# Patient Record
Sex: Male | Born: 1950 | Race: White | Hispanic: No | Marital: Married | Smoking: Current every day smoker
Health system: Southern US, Community
[De-identification: ages and names within clinical notes are randomized; demographics above are authoritative.]

## PROBLEM LIST (undated history)

## (undated) DIAGNOSIS — C801 Malignant (primary) neoplasm, unspecified: Secondary | ICD-10-CM

## (undated) DIAGNOSIS — J449 Chronic obstructive pulmonary disease, unspecified: Secondary | ICD-10-CM

## (undated) DIAGNOSIS — D509 Iron deficiency anemia, unspecified: Secondary | ICD-10-CM

## (undated) DIAGNOSIS — I4891 Unspecified atrial fibrillation: Secondary | ICD-10-CM

## (undated) HISTORY — PX: COLON SURGERY: SHX602

---

## 2003-12-14 ENCOUNTER — Other Ambulatory Visit: Payer: Self-pay

## 2004-04-18 ENCOUNTER — Emergency Department: Payer: Self-pay | Admitting: Emergency Medicine

## 2004-04-19 ENCOUNTER — Emergency Department: Payer: Self-pay | Admitting: Emergency Medicine

## 2004-04-20 ENCOUNTER — Inpatient Hospital Stay: Payer: Self-pay | Admitting: Internal Medicine

## 2008-08-26 ENCOUNTER — Emergency Department: Payer: Self-pay | Admitting: Emergency Medicine

## 2009-05-07 ENCOUNTER — Inpatient Hospital Stay: Payer: Self-pay | Admitting: Internal Medicine

## 2019-01-02 ENCOUNTER — Emergency Department: Payer: Medicare Other

## 2019-01-02 ENCOUNTER — Inpatient Hospital Stay
Admission: EM | Admit: 2019-01-02 | Discharge: 2019-01-10 | DRG: 481 | Disposition: A | Discharge status: Skilled Nursing Facility | Payer: Medicare Other | Attending: Internal Medicine | Admitting: Internal Medicine

## 2019-01-02 ENCOUNTER — Other Ambulatory Visit: Payer: Self-pay

## 2019-01-02 DIAGNOSIS — L89302 Pressure ulcer of unspecified buttock, stage 2: Secondary | ICD-10-CM | POA: Diagnosis not present

## 2019-01-02 DIAGNOSIS — L8990 Pressure ulcer of unspecified site, unspecified stage: Secondary | ICD-10-CM | POA: Insufficient documentation

## 2019-01-02 DIAGNOSIS — Z1159 Encounter for screening for other viral diseases: Secondary | ICD-10-CM

## 2019-01-02 DIAGNOSIS — Z7982 Long term (current) use of aspirin: Secondary | ICD-10-CM

## 2019-01-02 DIAGNOSIS — D72829 Elevated white blood cell count, unspecified: Secondary | ICD-10-CM | POA: Diagnosis present

## 2019-01-02 DIAGNOSIS — I4891 Unspecified atrial fibrillation: Secondary | ICD-10-CM | POA: Diagnosis not present

## 2019-01-02 DIAGNOSIS — W19XXXA Unspecified fall, initial encounter: Secondary | ICD-10-CM

## 2019-01-02 DIAGNOSIS — F17210 Nicotine dependence, cigarettes, uncomplicated: Secondary | ICD-10-CM | POA: Diagnosis present

## 2019-01-02 DIAGNOSIS — J9610 Chronic respiratory failure, unspecified whether with hypoxia or hypercapnia: Secondary | ICD-10-CM | POA: Diagnosis present

## 2019-01-02 DIAGNOSIS — Z419 Encounter for procedure for purposes other than remedying health state, unspecified: Secondary | ICD-10-CM

## 2019-01-02 DIAGNOSIS — D62 Acute posthemorrhagic anemia: Secondary | ICD-10-CM | POA: Diagnosis not present

## 2019-01-02 DIAGNOSIS — E871 Hypo-osmolality and hyponatremia: Secondary | ICD-10-CM | POA: Diagnosis present

## 2019-01-02 DIAGNOSIS — K567 Ileus, unspecified: Secondary | ICD-10-CM

## 2019-01-02 DIAGNOSIS — S72141A Displaced intertrochanteric fracture of right femur, initial encounter for closed fracture: Principal | ICD-10-CM | POA: Diagnosis present

## 2019-01-02 DIAGNOSIS — Z716 Tobacco abuse counseling: Secondary | ICD-10-CM

## 2019-01-02 DIAGNOSIS — I959 Hypotension, unspecified: Secondary | ICD-10-CM | POA: Diagnosis not present

## 2019-01-02 DIAGNOSIS — D519 Vitamin B12 deficiency anemia, unspecified: Secondary | ICD-10-CM | POA: Diagnosis present

## 2019-01-02 DIAGNOSIS — R059 Cough, unspecified: Secondary | ICD-10-CM

## 2019-01-02 DIAGNOSIS — E876 Hypokalemia: Secondary | ICD-10-CM | POA: Diagnosis not present

## 2019-01-02 DIAGNOSIS — Z882 Allergy status to sulfonamides status: Secondary | ICD-10-CM

## 2019-01-02 DIAGNOSIS — M25551 Pain in right hip: Secondary | ICD-10-CM

## 2019-01-02 DIAGNOSIS — R05 Cough: Secondary | ICD-10-CM

## 2019-01-02 DIAGNOSIS — J449 Chronic obstructive pulmonary disease, unspecified: Secondary | ICD-10-CM | POA: Diagnosis present

## 2019-01-02 DIAGNOSIS — Z859 Personal history of malignant neoplasm, unspecified: Secondary | ICD-10-CM

## 2019-01-02 DIAGNOSIS — S72001A Fracture of unspecified part of neck of right femur, initial encounter for closed fracture: Secondary | ICD-10-CM

## 2019-01-02 DIAGNOSIS — Z9981 Dependence on supplemental oxygen: Secondary | ICD-10-CM

## 2019-01-02 DIAGNOSIS — I97191 Other postprocedural cardiac functional disturbances following other surgery: Secondary | ICD-10-CM | POA: Diagnosis not present

## 2019-01-02 DIAGNOSIS — W1830XA Fall on same level, unspecified, initial encounter: Secondary | ICD-10-CM | POA: Diagnosis present

## 2019-01-02 DIAGNOSIS — D509 Iron deficiency anemia, unspecified: Secondary | ICD-10-CM | POA: Diagnosis present

## 2019-01-02 HISTORY — DX: Malignant (primary) neoplasm, unspecified: C80.1

## 2019-01-02 HISTORY — DX: Chronic obstructive pulmonary disease, unspecified: J44.9

## 2019-01-02 LAB — CBC WITH DIFFERENTIAL/PLATELET
Abs Immature Granulocytes: 0.09 10*3/uL — ABNORMAL HIGH (ref 0.00–0.07)
Basophils Absolute: 0.1 10*3/uL (ref 0.0–0.1)
Basophils Relative: 1 %
Eosinophils Absolute: 0.3 10*3/uL (ref 0.0–0.5)
Eosinophils Relative: 4 %
HCT: 34.2 % — ABNORMAL LOW (ref 39.0–52.0)
Hemoglobin: 11.7 g/dL — ABNORMAL LOW (ref 13.0–17.0)
Immature Granulocytes: 1 %
Lymphocytes Relative: 20 %
Lymphs Abs: 1.8 10*3/uL (ref 0.7–4.0)
MCH: 34.1 pg — ABNORMAL HIGH (ref 26.0–34.0)
MCHC: 34.2 g/dL (ref 30.0–36.0)
MCV: 99.7 fL (ref 80.0–100.0)
Monocytes Absolute: 0.9 10*3/uL (ref 0.1–1.0)
Monocytes Relative: 10 %
Neutro Abs: 5.6 10*3/uL (ref 1.7–7.7)
Neutrophils Relative %: 64 %
Platelets: 202 10*3/uL (ref 150–400)
RBC: 3.43 MIL/uL — ABNORMAL LOW (ref 4.22–5.81)
RDW: 12.1 % (ref 11.5–15.5)
WBC: 8.7 10*3/uL (ref 4.0–10.5)
nRBC: 0 % (ref 0.0–0.2)

## 2019-01-02 MED ORDER — MORPHINE SULFATE (PF) 4 MG/ML IV SOLN
4.0000 mg | Freq: Once | INTRAVENOUS | Status: AC
  Administered 2019-01-02: 4 mg via INTRAVENOUS

## 2019-01-02 MED ORDER — ONDANSETRON HCL 4 MG/2ML IJ SOLN
INTRAMUSCULAR | Status: AC
  Filled 2019-01-02: qty 2

## 2019-01-02 MED ORDER — ONDANSETRON HCL 4 MG/2ML IJ SOLN
4.0000 mg | Freq: Once | INTRAMUSCULAR | Status: AC
  Administered 2019-01-02: 4 mg via INTRAVENOUS

## 2019-01-02 MED ORDER — HYDROMORPHONE HCL 1 MG/ML IJ SOLN
INTRAMUSCULAR | Status: AC
  Filled 2019-01-02: qty 1

## 2019-01-02 MED ORDER — HYDROMORPHONE HCL 1 MG/ML IJ SOLN
0.5000 mg | Freq: Once | INTRAMUSCULAR | Status: AC
  Administered 2019-01-03: 0.5 mg via INTRAVENOUS
  Filled 2019-01-02: qty 1

## 2019-01-02 MED ORDER — MORPHINE SULFATE (PF) 4 MG/ML IV SOLN
INTRAVENOUS | Status: AC
  Filled 2019-01-02: qty 1

## 2019-01-02 NOTE — ED Triage Notes (Signed)
Mechanical fall, right hip pain, right foot rotation. Pt states he became tangled in a blanket when he got up from his chair. No dizziness. Hx of compression fractures in his back. No surgeries. Pt is on 2L of O2 at night.

## 2019-01-02 NOTE — ED Provider Notes (Signed)
[facility] Emergency Department Provider Note   ____________________________________________   First MD Initiated Contact with Patient 01/02/19 2345     (approximate)  I have reviewed the triage vital signs and the nursing notes.   HISTORY  Chief Complaint Hip Pain    HPI Darren Coleman is a 68 y.o. male brought to the ED from home status post mechanical fall with right hip pain.  Patient states he got tangled in a blanket when he got up from his chair, falling and striking his right hip.  Did not strike his head or suffer LOC.  Wears oxygen at night.  Only takes baby aspirin daily.  Denies recent fever, cough, chest pain, shortness of breath, abdominal pain, nausea or vomiting.       Past Medical History:  Diagnosis Date  . Cancer ([place])   . COPD (chronic obstructive pulmonary disease) [facility])     Patient Active Problem List   Diagnosis Date Noted  . Closed right hip fracture ([place]) 01/03/2019  . COPD (chronic obstructive pulmonary disease) ([place]) 01/03/2019     Prior to Admission medications   Not on File    Allergies Sulfa antibiotics  History reviewed. No pertinent family history.  Social History Social History   Tobacco Use  . Smoking status: Current Every Day Smoker    Packs/day: 0.50    Types: Cigarettes  . Smokeless tobacco: Never Used  Substance Use Topics  . Alcohol use: Yes    Comment: sporadic  . Drug use: Never    Review of Systems  Constitutional: No fever/chills Eyes: No visual changes. ENT: No sore throat. Cardiovascular: Denies chest pain. Respiratory: Denies shortness of breath. Gastrointestinal: No abdominal pain.  No nausea, no vomiting.  No diarrhea.  No constipation. Genitourinary: Negative for dysuria. Musculoskeletal: Position for right hip pain. Negative for back pain. Skin: Negative for rash. Neurological: Negative for headaches, focal weakness or  numbness.   ____________________________________________   PHYSICAL EXAM:  VITAL SIGNS: ED Triage Vitals  Enc Vitals Group     BP 01/02/19 2313 (!) 153/131     Pulse Rate 01/02/19 2313 (!) 102     Resp --      Temp 01/02/19 2313 98.6 F (37 C)     Temp Source 01/02/19 2313 Oral     SpO2 01/02/19 2313 93 %     Weight 01/02/19 2315 271 lb 2.7 oz (123 kg)     Height 01/02/19 2315 5\' 6"  (1.676 m)     Head Circumference --      Peak Flow --      Pain Score 01/02/19 2315 10     Pain Loc --      Pain Edu? --      Excl. in [place]? --     Constitutional: Alert and oriented. Uncomfortable appearing and in mild acute distress. Eyes: Conjunctivae are normal. PERRL. EOMI. Head: Atraumatic. Nose: No congestion/rhinnorhea. Mouth/Throat: Mucous membranes are moist.  Oropharynx non-erythematous. Neck: No stridor.   Cardiovascular: Normal rate, regular rhythm. Grossly normal heart sounds.  Good peripheral circulation. Respiratory: Normal respiratory effort.  No retractions. Lungs CTAB. Gastrointestinal: Soft and nontender. No distention. No abdominal bruits. No CVA tenderness. Musculoskeletal: Right lower leg externally rotated and shortened. Tender to palpation right hip with decreased ROM secondary to pain. 2+ distal pulses. Neurologic:  Normal speech and language. No gross focal neurologic deficits are appreciated.  Skin:  Skin is warm, dry and intact. No rash noted. Psychiatric: Mood and  affect are normal. Speech and behavior are normal.  ____________________________________________   LABS (all labs ordered are listed, but only abnormal results are displayed)  Labs Reviewed  CBC WITH DIFFERENTIAL/PLATELET - Abnormal; Notable for the following components:      Result Value   RBC 3.43 (*)    Hemoglobin 11.7 (*)    HCT 34.2 (*)    MCH 34.1 (*)    Abs Immature Granulocytes 0.09 (*)    All other components within normal limits  COMPREHENSIVE METABOLIC PANEL - Abnormal; Notable for  the following components:   Sodium 130 (*)    Chloride 92 (*)    Creatinine, Ser 0.56 (*)    Calcium 8.6 (*)    AST 53 (*)    ALT 56 (*)    Alkaline Phosphatase 175 (*)    All other components within normal limits  SARS CORONAVIRUS 2 (HOSPITAL ORDER, [facility])  SURGICAL PCR SCREEN  TROPONIN I  PROTIME-INR  HIV ANTIBODY (ROUTINE TESTING W REFLEX)  BASIC METABOLIC PANEL  CBC  TYPE AND SCREEN  TYPE AND SCREEN  TYPE AND SCREEN   ____________________________________________  EKG  None  ____________________________________________  RADIOLOGY  ED MD interpretation: Chest x-ray demonstrates COPD; right hip x-ray demonstrates displaced intertrochanteric fracture  Official radiology report(s): Dg Chest 1 View  Result Date: 01/02/2019 CLINICAL DATA:  Post fall. Right hip fracture. Preop. EXAM: CHEST  1 VIEW COMPARISON:  Radiograph 08/26/2008 FINDINGS: The lungs are hyperinflated. Heart is normal in size. Aortic tortuosity and atherosclerosis. Multiple skin folds project over the right hemithorax and axilla. No focal airspace disease, pulmonary edema, large pleural effusion or pneumothorax. No acute osseous abnormalities are seen. IMPRESSION: 1. Hyperinflation without acute abnormality. 2. Aortic tortuosity and atherosclerosis. 3. Multiple skin folds project over the right hemithorax and axilla. Electronically Signed   By: Keith Rake M.D.   On: 01/02/2019 23:56   Dg Hip Unilat With Pelvis 2-3 Views Right  Result Date: 01/02/2019 CLINICAL DATA:  Fall with right hip pain. EXAM: DG HIP (WITH OR WITHOUT PELVIS) 2-3V RIGHT COMPARISON:  None. FINDINGS: Displaced intertrochanteric right femur fracture involves both greater and lesser trochanters. Mild rotational component. The femoral head remains seated. The remainder the pelvis is intact. No additional acute fracture. Bones are under mineralized. Pubic symphysis and sacroiliac joints are congruent. Surgical  sutures project over the lower abdomen. Chain sutures in the pelvis. IMPRESSION: Displaced intertrochanteric right femur fracture. Electronically Signed   By: Keith Rake M.D.   On: 01/02/2019 23:54    ____________________________________________   PROCEDURES  Procedure(s) performed (including Critical Care):  Procedures   ____________________________________________   INITIAL IMPRESSION / ASSESSMENT AND PLAN / ED COURSE  As part of my medical decision making, I reviewed the following data within the [place] notes reviewed and incorporated, Labs reviewed, EKG interpreted, Old chart reviewed, Radiograph reviewed, Discussed with admitting physician Dr. Jannifer Franklin and Notes from prior ED visits     CARMICHAEL BURDETTE was evaluated in Emergency Department on 01/03/2019 for the symptoms described in the history of present illness. He was evaluated in the context of the global COVID-19 pandemic, which necessitated consideration that the patient might be at risk for infection with the SARS-CoV-2 virus that causes COVID-19. Institutional protocols and algorithms that pertain to the evaluation of patients at risk for COVID-19 are in a state of rapid change based on information released by regulatory bodies including the CDC and federal and state  organizations. These policies and algorithms were followed during the patient's care in the ED.   68 year old male who presents s/p mechanical fall with right hip pain. Differential diagnosis includes but is not limited to right hip fracture, dislocation, musculoskeletal contusion, etc.  Will administer IV morphine for pain and proceed with plain film xrays.  Clinical Course as of Jan 02 510  Wed Jan 03, 2019  0510 Chart review addendum: Spoke with Dr. Roland Rack from orthopedics and patient was admitted to Dr. Jannifer Franklin from hospitalist services.   [JS]    Clinical Course User Index [JS] Paulette Blanch, MD      ____________________________________________   FINAL CLINICAL IMPRESSION(S) / ED DIAGNOSES  Final diagnoses:  Fall, initial encounter  Right hip pain  Closed displaced intertrochanteric fracture of right femur, initial encounter [facility])     ED Discharge Orders    None       Note:  This document was prepared using Dragon voice recognition software and may include unintentional dictation errors.   Paulette Blanch, MD 01/03/19 [phone]

## 2019-01-03 ENCOUNTER — Inpatient Hospital Stay: Payer: Medicare Other | Admitting: Anesthesiology

## 2019-01-03 ENCOUNTER — Encounter: Payer: Self-pay | Admitting: Internal Medicine

## 2019-01-03 ENCOUNTER — Inpatient Hospital Stay: Payer: Medicare Other

## 2019-01-03 ENCOUNTER — Encounter
Admission: EM | Disposition: A | Discharge status: Skilled Nursing Facility | Payer: Self-pay | Source: Home / Self Care | Attending: Internal Medicine

## 2019-01-03 DIAGNOSIS — K567 Ileus, unspecified: Secondary | ICD-10-CM | POA: Diagnosis not present

## 2019-01-03 DIAGNOSIS — Z859 Personal history of malignant neoplasm, unspecified: Secondary | ICD-10-CM | POA: Diagnosis not present

## 2019-01-03 DIAGNOSIS — D509 Iron deficiency anemia, unspecified: Secondary | ICD-10-CM | POA: Diagnosis present

## 2019-01-03 DIAGNOSIS — E876 Hypokalemia: Secondary | ICD-10-CM | POA: Diagnosis not present

## 2019-01-03 DIAGNOSIS — I959 Hypotension, unspecified: Secondary | ICD-10-CM | POA: Diagnosis not present

## 2019-01-03 DIAGNOSIS — Z882 Allergy status to sulfonamides status: Secondary | ICD-10-CM | POA: Diagnosis not present

## 2019-01-03 DIAGNOSIS — E871 Hypo-osmolality and hyponatremia: Secondary | ICD-10-CM | POA: Diagnosis present

## 2019-01-03 DIAGNOSIS — M25551 Pain in right hip: Secondary | ICD-10-CM | POA: Diagnosis present

## 2019-01-03 DIAGNOSIS — S72141A Displaced intertrochanteric fracture of right femur, initial encounter for closed fracture: Secondary | ICD-10-CM | POA: Diagnosis present

## 2019-01-03 DIAGNOSIS — S72001A Fracture of unspecified part of neck of right femur, initial encounter for closed fracture: Secondary | ICD-10-CM | POA: Diagnosis present

## 2019-01-03 DIAGNOSIS — J449 Chronic obstructive pulmonary disease, unspecified: Secondary | ICD-10-CM | POA: Diagnosis present

## 2019-01-03 DIAGNOSIS — Z7982 Long term (current) use of aspirin: Secondary | ICD-10-CM | POA: Diagnosis not present

## 2019-01-03 DIAGNOSIS — Z716 Tobacco abuse counseling: Secondary | ICD-10-CM | POA: Diagnosis not present

## 2019-01-03 DIAGNOSIS — I4891 Unspecified atrial fibrillation: Secondary | ICD-10-CM | POA: Diagnosis not present

## 2019-01-03 DIAGNOSIS — D62 Acute posthemorrhagic anemia: Secondary | ICD-10-CM | POA: Diagnosis not present

## 2019-01-03 DIAGNOSIS — Z9981 Dependence on supplemental oxygen: Secondary | ICD-10-CM | POA: Diagnosis not present

## 2019-01-03 DIAGNOSIS — I361 Nonrheumatic tricuspid (valve) insufficiency: Secondary | ICD-10-CM | POA: Diagnosis not present

## 2019-01-03 DIAGNOSIS — Z1159 Encounter for screening for other viral diseases: Secondary | ICD-10-CM | POA: Diagnosis not present

## 2019-01-03 DIAGNOSIS — D519 Vitamin B12 deficiency anemia, unspecified: Secondary | ICD-10-CM | POA: Diagnosis present

## 2019-01-03 DIAGNOSIS — L89302 Pressure ulcer of unspecified buttock, stage 2: Secondary | ICD-10-CM | POA: Diagnosis not present

## 2019-01-03 DIAGNOSIS — W1830XA Fall on same level, unspecified, initial encounter: Secondary | ICD-10-CM | POA: Diagnosis present

## 2019-01-03 DIAGNOSIS — J9610 Chronic respiratory failure, unspecified whether with hypoxia or hypercapnia: Secondary | ICD-10-CM | POA: Diagnosis present

## 2019-01-03 DIAGNOSIS — F17210 Nicotine dependence, cigarettes, uncomplicated: Secondary | ICD-10-CM | POA: Diagnosis present

## 2019-01-03 DIAGNOSIS — I97191 Other postprocedural cardiac functional disturbances following other surgery: Secondary | ICD-10-CM | POA: Diagnosis not present

## 2019-01-03 DIAGNOSIS — D72829 Elevated white blood cell count, unspecified: Secondary | ICD-10-CM | POA: Diagnosis present

## 2019-01-03 HISTORY — PX: INTRAMEDULLARY (IM) NAIL INTERTROCHANTERIC: SHX5875

## 2019-01-03 LAB — BASIC METABOLIC PANEL
Anion gap: 10 (ref 5–15)
BUN: 9 mg/dL (ref 8–23)
CO2: 29 mmol/L (ref 22–32)
Calcium: 8.5 mg/dL — ABNORMAL LOW (ref 8.9–10.3)
Chloride: 93 mmol/L — ABNORMAL LOW (ref 98–111)
Creatinine, Ser: 0.51 mg/dL — ABNORMAL LOW (ref 0.61–1.24)
GFR calc Af Amer: >60 mL/min (ref >60)
GFR calc non Af Amer: >60 mL/min (ref >60)
Glucose, Bld: 96 mg/dL (ref 70–99)
Potassium: 3.6 mmol/L (ref 3.5–5.1)
Sodium: 132 mmol/L — ABNORMAL LOW (ref 135–145)

## 2019-01-03 LAB — TROPONIN I: Troponin I: <0.03 ng/mL (ref <0.03)

## 2019-01-03 LAB — COMPREHENSIVE METABOLIC PANEL
ALT: 56 U/L — ABNORMAL HIGH (ref 0–44)
AST: 53 U/L — ABNORMAL HIGH (ref 15–41)
Albumin: 3.6 g/dL (ref 3.5–5.0)
Alkaline Phosphatase: 175 U/L — ABNORMAL HIGH (ref 38–126)
Anion gap: 13 (ref 5–15)
BUN: 8 mg/dL (ref 8–23)
CO2: 25 mmol/L (ref 22–32)
Calcium: 8.6 mg/dL — ABNORMAL LOW (ref 8.9–10.3)
Chloride: 92 mmol/L — ABNORMAL LOW (ref 98–111)
Creatinine, Ser: 0.56 mg/dL — ABNORMAL LOW (ref 0.61–1.24)
GFR calc Af Amer: >60 mL/min (ref >60)
GFR calc non Af Amer: >60 mL/min (ref >60)
Glucose, Bld: 85 mg/dL (ref 70–99)
Potassium: 4 mmol/L (ref 3.5–5.1)
Sodium: 130 mmol/L — ABNORMAL LOW (ref 135–145)
Total Bilirubin: 0.6 mg/dL (ref 0.3–1.2)
Total Protein: 6.5 g/dL (ref 6.5–8.1)

## 2019-01-03 LAB — SURGICAL PCR SCREEN
MRSA, PCR: NEGATIVE
Staphylococcus aureus: NEGATIVE

## 2019-01-03 LAB — CBC
HCT: 29.8 % — ABNORMAL LOW (ref 39.0–52.0)
Hemoglobin: 10.1 g/dL — ABNORMAL LOW (ref 13.0–17.0)
MCH: 33.7 pg (ref 26.0–34.0)
MCHC: 33.9 g/dL (ref 30.0–36.0)
MCV: 99.3 fL (ref 80.0–100.0)
Platelets: 198 10*3/uL (ref 150–400)
RBC: 3 MIL/uL — ABNORMAL LOW (ref 4.22–5.81)
RDW: 12.1 % (ref 11.5–15.5)
WBC: 17.9 10*3/uL — ABNORMAL HIGH (ref 4.0–10.5)
nRBC: 0 % (ref 0.0–0.2)

## 2019-01-03 LAB — PROTIME-INR
INR: 0.9 (ref 0.8–1.2)
Prothrombin Time: 12.4 seconds (ref 11.4–15.2)

## 2019-01-03 LAB — SARS CORONAVIRUS 2 BY RT PCR (HOSPITAL ORDER, PERFORMED IN ~~LOC~~ HOSPITAL LAB): SARS Coronavirus 2: NEGATIVE

## 2019-01-03 SURGERY — FIXATION, FRACTURE, INTERTROCHANTERIC, WITH INTRAMEDULLARY ROD
Anesthesia: Spinal | Site: Hip | Laterality: Right

## 2019-01-03 MED ORDER — SODIUM CHLORIDE (PF) 0.9 % IJ SOLN
INTRAMUSCULAR | Status: AC
  Filled 2019-01-03: qty 20

## 2019-01-03 MED ORDER — TRAMADOL HCL 50 MG PO TABS
50.0000 mg | ORAL_TABLET | Freq: Four times a day (QID) | ORAL | Status: DC
  Administered 2019-01-04 – 2019-01-10 (×23): 50 mg via ORAL
  Filled 2019-01-03 (×24): qty 1

## 2019-01-03 MED ORDER — CEFAZOLIN SODIUM-DEXTROSE 2-4 GM/100ML-% IV SOLN
2.0000 g | Freq: Once | INTRAVENOUS | Status: DC
  Filled 2019-01-03: qty 100

## 2019-01-03 MED ORDER — METOCLOPRAMIDE HCL 5 MG/ML IJ SOLN
5.0000 mg | Freq: Three times a day (TID) | INTRAMUSCULAR | Status: DC | PRN
  Filled 2019-01-03: qty 2

## 2019-01-03 MED ORDER — FENTANYL CITRATE (PF) 100 MCG/2ML IJ SOLN
INTRAMUSCULAR | Status: AC
  Filled 2019-01-03: qty 2

## 2019-01-03 MED ORDER — BUPIVACAINE HCL (PF) 0.5 % IJ SOLN
INTRAMUSCULAR | Status: AC
  Filled 2019-01-03: qty 10

## 2019-01-03 MED ORDER — ALBUTEROL SULFATE (2.5 MG/3ML) 0.083% IN NEBU
2.5000 mg | INHALATION_SOLUTION | RESPIRATORY_TRACT | Status: DC | PRN
  Administered 2019-01-04 – 2019-01-10 (×8): 2.5 mg via RESPIRATORY_TRACT
  Filled 2019-01-03 (×8): qty 3

## 2019-01-03 MED ORDER — METHOCARBAMOL 1000 MG/10ML IJ SOLN
750.0000 mg | Freq: Once | INTRAVENOUS | Status: AC
  Administered 2019-01-03: 750 mg via INTRAVENOUS
  Filled 2019-01-03: qty 7.5

## 2019-01-03 MED ORDER — IPRATROPIUM-ALBUTEROL 0.5-2.5 (3) MG/3ML IN SOLN
3.0000 mL | Freq: Four times a day (QID) | RESPIRATORY_TRACT | Status: DC
  Administered 2019-01-03: 3 mL via RESPIRATORY_TRACT
  Filled 2019-01-03: qty 3

## 2019-01-03 MED ORDER — LACTATED RINGERS IV SOLN
INTRAVENOUS | Status: DC | PRN
  Administered 2019-01-03: 21:00:00 via INTRAVENOUS

## 2019-01-03 MED ORDER — CEFAZOLIN SODIUM-DEXTROSE 2-4 GM/100ML-% IV SOLN
2.0000 g | Freq: Four times a day (QID) | INTRAVENOUS | Status: AC
  Administered 2019-01-04 (×3): 2 g via INTRAVENOUS
  Filled 2019-01-03 (×5): qty 100

## 2019-01-03 MED ORDER — NICOTINE 14 MG/24HR TD PT24
14.0000 mg | MEDICATED_PATCH | Freq: Every day | TRANSDERMAL | Status: DC
  Administered 2019-01-03 – 2019-01-10 (×8): 14 mg via TRANSDERMAL
  Filled 2019-01-03 (×8): qty 1

## 2019-01-03 MED ORDER — METOCLOPRAMIDE HCL 10 MG PO TABS: 5.0000 mg | ORAL_TABLET | Freq: Three times a day (TID) | ORAL | Status: DC | PRN

## 2019-01-03 MED ORDER — PROPOFOL 500 MG/50ML IV EMUL
INTRAVENOUS | Status: AC
  Filled 2019-01-03: qty 50

## 2019-01-03 MED ORDER — ONDANSETRON HCL 4 MG PO TABS: 4.0000 mg | ORAL_TABLET | Freq: Four times a day (QID) | ORAL | Status: DC | PRN

## 2019-01-03 MED ORDER — MAGNESIUM HYDROXIDE 400 MG/5ML PO SUSP
30.0000 mL | Freq: Every day | ORAL | Status: DC | PRN
  Administered 2019-01-05: 30 mL via ORAL
  Filled 2019-01-03: qty 30

## 2019-01-03 MED ORDER — ACETAMINOPHEN 325 MG PO TABS: 325.0000 mg | ORAL_TABLET | Freq: Four times a day (QID) | ORAL | Status: DC | PRN

## 2019-01-03 MED ORDER — ACETAMINOPHEN 325 MG PO TABS: 650.0000 mg | ORAL_TABLET | Freq: Four times a day (QID) | ORAL | Status: DC | PRN

## 2019-01-03 MED ORDER — CEFAZOLIN SODIUM-DEXTROSE 2-3 GM-%(50ML) IV SOLR
INTRAVENOUS | Status: DC | PRN
  Administered 2019-01-03: 2 g via INTRAVENOUS

## 2019-01-03 MED ORDER — VASOPRESSIN 20 UNIT/ML IV SOLN
INTRAVENOUS | Status: DC | PRN
  Administered 2019-01-03: 4 [IU] via INTRAVENOUS
  Administered 2019-01-03 (×3): 2 [IU] via INTRAVENOUS
  Administered 2019-01-03: 4 [IU] via INTRAVENOUS

## 2019-01-03 MED ORDER — ACETAMINOPHEN 650 MG RE SUPP: 650.0000 mg | Freq: Four times a day (QID) | RECTAL | Status: DC | PRN

## 2019-01-03 MED ORDER — DOCUSATE SODIUM 100 MG PO CAPS
100.0000 mg | ORAL_CAPSULE | Freq: Two times a day (BID) | ORAL | Status: DC
  Administered 2019-01-04 – 2019-01-10 (×10): 100 mg via ORAL
  Filled 2019-01-03 (×12): qty 1

## 2019-01-03 MED ORDER — IPRATROPIUM-ALBUTEROL 0.5-2.5 (3) MG/3ML IN SOLN
3.0000 mL | Freq: Once | RESPIRATORY_TRACT | Status: AC
  Administered 2019-01-03: 3 mL via RESPIRATORY_TRACT

## 2019-01-03 MED ORDER — FLEET ENEMA 7-19 GM/118ML RE ENEM: 1.0000 | ENEMA | Freq: Once | RECTAL | Status: DC | PRN

## 2019-01-03 MED ORDER — DIPHENHYDRAMINE HCL 12.5 MG/5ML PO ELIX
12.5000 mg | ORAL_SOLUTION | ORAL | Status: DC | PRN
  Filled 2019-01-03: qty 10

## 2019-01-03 MED ORDER — BISACODYL 10 MG RE SUPP
10.0000 mg | Freq: Every day | RECTAL | Status: DC | PRN
  Administered 2019-01-07: 10 mg via RECTAL
  Filled 2019-01-03 (×2): qty 1

## 2019-01-03 MED ORDER — HYDROMORPHONE HCL 1 MG/ML IJ SOLN
0.5000 mg | INTRAMUSCULAR | Status: DC | PRN
  Administered 2019-01-04 (×2): 1 mg via INTRAVENOUS
  Filled 2019-01-03 (×2): qty 1

## 2019-01-03 MED ORDER — MORPHINE SULFATE (PF) 2 MG/ML IV SOLN
2.0000 mg | INTRAVENOUS | Status: DC | PRN
  Administered 2019-01-03: 2 mg via INTRAVENOUS
  Filled 2019-01-03: qty 1

## 2019-01-03 MED ORDER — ENOXAPARIN SODIUM 40 MG/0.4ML ~~LOC~~ SOLN
40.0000 mg | SUBCUTANEOUS | Status: DC
  Administered 2019-01-04 – 2019-01-09 (×6): 40 mg via SUBCUTANEOUS
  Filled 2019-01-03 (×6): qty 0.4

## 2019-01-03 MED ORDER — MIDAZOLAM HCL 2 MG/2ML IJ SOLN
INTRAMUSCULAR | Status: AC
  Filled 2019-01-03: qty 2

## 2019-01-03 MED ORDER — SODIUM CHLORIDE 0.9 % IV SOLN
INTRAVENOUS | Status: DC
  Administered 2019-01-03: via INTRAVENOUS

## 2019-01-03 MED ORDER — VASOPRESSIN 20 UNIT/ML IV SOLN
INTRAVENOUS | Status: AC
  Filled 2019-01-03: qty 1

## 2019-01-03 MED ORDER — SODIUM CHLORIDE 0.9 % IR SOLN
Status: DC | PRN
  Administered 2019-01-03: 600 mL

## 2019-01-03 MED ORDER — KETAMINE HCL 50 MG/ML IJ SOLN
INTRAMUSCULAR | Status: DC | PRN
  Administered 2019-01-03 (×2): 25 mg via INTRAMUSCULAR

## 2019-01-03 MED ORDER — PROPOFOL 10 MG/ML IV BOLUS
INTRAVENOUS | Status: AC
  Filled 2019-01-03: qty 20

## 2019-01-03 MED ORDER — CEFAZOLIN SODIUM 1 G IJ SOLR
INTRAMUSCULAR | Status: AC
  Filled 2019-01-03: qty 20

## 2019-01-03 MED ORDER — HYDROMORPHONE HCL 1 MG/ML IJ SOLN
0.5000 mg | Freq: Once | INTRAMUSCULAR | Status: AC
  Administered 2019-01-03: 0.5 mg via INTRAVENOUS
  Filled 2019-01-03: qty 1

## 2019-01-03 MED ORDER — PROPOFOL 10 MG/ML IV BOLUS
INTRAVENOUS | Status: DC | PRN
  Administered 2019-01-03: 30 mg via INTRAVENOUS

## 2019-01-03 MED ORDER — OXYCODONE HCL 5 MG PO TABS
5.0000 mg | ORAL_TABLET | ORAL | Status: DC | PRN
  Administered 2019-01-03 (×3): 5 mg via ORAL
  Filled 2019-01-03 (×3): qty 1

## 2019-01-03 MED ORDER — FENTANYL CITRATE (PF) 100 MCG/2ML IJ SOLN
INTRAMUSCULAR | Status: DC | PRN
  Administered 2019-01-03 (×2): 25 ug via INTRAVENOUS

## 2019-01-03 MED ORDER — HYDROMORPHONE HCL 1 MG/ML IJ SOLN
1.0000 mg | INTRAMUSCULAR | Status: DC | PRN
  Administered 2019-01-03 (×4): 1 mg via INTRAVENOUS
  Filled 2019-01-03 (×4): qty 1

## 2019-01-03 MED ORDER — BUPIVACAINE HCL (PF) 0.5 % IJ SOLN
INTRAMUSCULAR | Status: DC | PRN
  Administered 2019-01-03: 3 mL

## 2019-01-03 MED ORDER — KETAMINE HCL 50 MG/ML IJ SOLN
INTRAMUSCULAR | Status: AC
  Filled 2019-01-03: qty 10

## 2019-01-03 MED ORDER — OXYCODONE HCL 5 MG PO TABS
5.0000 mg | ORAL_TABLET | ORAL | Status: DC | PRN
  Administered 2019-01-04: 10 mg via ORAL
  Administered 2019-01-04 (×2): 5 mg via ORAL
  Administered 2019-01-04 – 2019-01-10 (×28): 10 mg via ORAL
  Filled 2019-01-03: qty 1
  Filled 2019-01-03 (×7): qty 2
  Filled 2019-01-03: qty 1
  Filled 2019-01-03 (×23): qty 2

## 2019-01-03 MED ORDER — ESMOLOL HCL 100 MG/10ML IV SOLN
INTRAVENOUS | Status: DC | PRN
  Administered 2019-01-03: 10 mg via INTRAVENOUS

## 2019-01-03 MED ORDER — ACETAMINOPHEN 500 MG PO TABS
1000.0000 mg | ORAL_TABLET | Freq: Four times a day (QID) | ORAL | Status: AC
  Administered 2019-01-03 – 2019-01-04 (×4): 1000 mg via ORAL
  Filled 2019-01-03 (×4): qty 2

## 2019-01-03 MED ORDER — OXYCODONE HCL 5 MG PO TABS
5.0000 mg | ORAL_TABLET | ORAL | Status: DC | PRN
  Administered 2019-01-03: 5 mg via ORAL
  Filled 2019-01-03: qty 1

## 2019-01-03 MED ORDER — HYDROMORPHONE HCL 1 MG/ML IJ SOLN
0.5000 mg | INTRAMUSCULAR | Status: DC | PRN
  Administered 2019-01-03: 0.5 mg via INTRAVENOUS
  Filled 2019-01-03: qty 1

## 2019-01-03 MED ORDER — ESMOLOL HCL 100 MG/10ML IV SOLN
INTRAVENOUS | Status: AC
  Filled 2019-01-03: qty 10

## 2019-01-03 MED ORDER — PHENYLEPHRINE HCL (PRESSORS) 10 MG/ML IV SOLN
INTRAVENOUS | Status: DC | PRN
  Administered 2019-01-03 (×2): 200 ug via INTRAVENOUS

## 2019-01-03 MED ORDER — ONDANSETRON HCL 4 MG/2ML IJ SOLN: 4.0000 mg | Freq: Four times a day (QID) | INTRAMUSCULAR | Status: DC | PRN

## 2019-01-03 MED ORDER — PROPOFOL 500 MG/50ML IV EMUL
INTRAVENOUS | Status: DC | PRN
  Administered 2019-01-03: 50 ug/kg/min via INTRAVENOUS

## 2019-01-03 MED ORDER — BUPIVACAINE-EPINEPHRINE (PF) 0.5% -1:200000 IJ SOLN
INTRAMUSCULAR | Status: DC | PRN
  Administered 2019-01-03: 30 mL

## 2019-01-03 MED ORDER — IPRATROPIUM-ALBUTEROL 0.5-2.5 (3) MG/3ML IN SOLN
3.0000 mL | Freq: Three times a day (TID) | RESPIRATORY_TRACT | Status: DC
  Administered 2019-01-03 – 2019-01-10 (×20): 3 mL via RESPIRATORY_TRACT
  Filled 2019-01-03 (×22): qty 3

## 2019-01-03 SURGICAL SUPPLY — 46 items
BIT DRILL 4.3MMS DISTAL GRDTED (BIT) ×1 IMPLANT
BNDG COHESIVE 4X5 TAN STRL (GAUZE/BANDAGES/DRESSINGS) ×3 IMPLANT
BNDG COHESIVE 6X5 TAN STRL LF (GAUZE/BANDAGES/DRESSINGS) ×3 IMPLANT
CANISTER SUCT 1200ML W/VALVE (MISCELLANEOUS) ×3 IMPLANT
CHLORAPREP W/TINT 26 (MISCELLANEOUS) ×6 IMPLANT
COVER WAND RF STERILE (DRAPES) IMPLANT
DRAPE C-ARMOR (DRAPES) ×3 IMPLANT
DRAPE SHEET LG 3/4 BI-LAMINATE (DRAPES) ×3 IMPLANT
DRILL 4.3MMS DISTAL GRADUATED (BIT) ×3
DRSG OPSITE POSTOP 3X4 (GAUZE/BANDAGES/DRESSINGS) IMPLANT
DRSG OPSITE POSTOP 4X6 (GAUZE/BANDAGES/DRESSINGS) IMPLANT
ELECT CAUTERY BLADE 6.4 (BLADE) ×3 IMPLANT
ELECT REM PT RETURN 9FT ADLT (ELECTROSURGICAL) ×3
ELECTRODE REM PT RTRN 9FT ADLT (ELECTROSURGICAL) ×1 IMPLANT
GAUZE SPONGE 4X4 12PLY STRL (GAUZE/BANDAGES/DRESSINGS) ×3 IMPLANT
GLOVE BIO SURGEON STRL SZ 6.5 (GLOVE) ×4 IMPLANT
GLOVE BIO SURGEON STRL SZ8 (GLOVE) ×6 IMPLANT
GLOVE BIO SURGEONS STRL SZ 6.5 (GLOVE) ×2
GLOVE BIOGEL M 7.0 STRL (GLOVE) ×3 IMPLANT
GLOVE BIOGEL PI IND STRL 7.5 (GLOVE) ×1 IMPLANT
GLOVE BIOGEL PI INDICATOR 7.5 (GLOVE) ×2
GLOVE INDICATOR 8.0 STRL GRN (GLOVE) ×3 IMPLANT
GOWN STRL REUS W/ TWL LRG LVL3 (GOWN DISPOSABLE) ×1 IMPLANT
GOWN STRL REUS W/ TWL XL LVL3 (GOWN DISPOSABLE) ×1 IMPLANT
GOWN STRL REUS W/TWL LRG LVL3 (GOWN DISPOSABLE) ×2
GOWN STRL REUS W/TWL XL LVL3 (GOWN DISPOSABLE) ×2
GUIDEPIN VERSANAIL DSP 3.2X444 (ORTHOPEDIC DISPOSABLE SUPPLIES) ×3 IMPLANT
GUIDEWIRE BALL NOSE 80CM (WIRE) ×3 IMPLANT
MAT ABSORB  FLUID 56X50 GRAY (MISCELLANEOUS) ×2
MAT ABSORB FLUID 56X50 GRAY (MISCELLANEOUS) ×1 IMPLANT
NAIL HIP FRAC RT 130 11MX400M (Nail) ×3 IMPLANT
NEEDLE FILTER BLUNT 18X 1/2SAF (NEEDLE)
NEEDLE FILTER BLUNT 18X1 1/2 (NEEDLE) IMPLANT
NEEDLE HYPO 22GX1.5 SAFETY (NEEDLE) ×3 IMPLANT
NS IRRIG 500ML POUR BTL (IV SOLUTION) IMPLANT
PACK HIP COMPR (MISCELLANEOUS) ×3 IMPLANT
SCREW BONE CORTICAL 5.0X50 (Screw) ×3 IMPLANT
SCREW LAG HIP NAIL 10.5X95 (Screw) ×3 IMPLANT
STAPLER SKIN PROX 35W (STAPLE) ×3 IMPLANT
STRAP SAFETY 5IN WIDE (MISCELLANEOUS) ×3 IMPLANT
SUT VIC AB 0 CT1 36 (SUTURE) ×3 IMPLANT
SUT VIC AB 1 CT1 36 (SUTURE) ×3 IMPLANT
SUT VIC AB 2-0 CT1 (SUTURE) ×6 IMPLANT
SYR 10ML LL (SYRINGE) ×3 IMPLANT
SYR 30ML LL (SYRINGE) ×3 IMPLANT
TAPE MICROFOAM 4IN (TAPE) IMPLANT

## 2019-01-03 NOTE — Anesthesia Procedure Notes (Signed)
Spinal  Patient location during procedure: OR Start time: 01/03/2019 8:48 PM End time: 01/03/2019 8:51 PM Staffing Anesthesiologist: Durenda Hurt, MD Resident/CRNA: Jonna Clark, CRNA Performed: anesthesiologist  Preanesthetic Checklist Completed: patient identified, site marked, surgical consent, pre-op evaluation, timeout performed, IV checked, risks and benefits discussed and monitors and equipment checked Spinal Block Patient position: sitting Prep: ChloraPrep Patient monitoring: heart rate, continuous pulse ox, blood pressure and cardiac monitor Approach: midline Location: L3-4 Injection technique: single-shot Needle Needle type: Whitacre and Introducer  Needle gauge: 24 G Needle length: 9 cm Additional Notes Negative paresthesia. Negative blood return. Positive free-flowing CSF. Patient tolerated procedure well, without complications.

## 2019-01-03 NOTE — H&P (Signed)
[facility] at [place] NAME: Darren Coleman    MR#:  [id number]  DATE OF BIRTH:  June 09, 1951  DATE OF ADMISSION:  01/02/2019  PRIMARY CARE PHYSICIAN: No primary care provider on file.   REQUESTING/REFERRING PHYSICIAN: Beather Arbour, MD  CHIEF COMPLAINT:   Chief Complaint  Patient presents with  . Hip Pain    HISTORY OF PRESENT ILLNESS:  Darren Coleman  is a 68 y.o. male who presents with chief complaint as above.  Patient presents after mechanical fall at home onto his right side with subsequent hip pain.  Imaging here in the ED confirms right hip fracture.  Orthopedic surgery was contacted by ED physician.  Hospitalist were called for admission  PAST MEDICAL HISTORY:   Past Medical History:  Diagnosis Date  . Cancer ([place])   . COPD (chronic obstructive pulmonary disease) ([place])      PAST SURGICAL HISTORY:   Past Surgical History:  Procedure Laterality Date  . COLON SURGERY       SOCIAL HISTORY:   Social History   Tobacco Use  . Smoking status: Current Every Day Smoker    Packs/day: 0.50    Types: Cigarettes  . Smokeless tobacco: Never Used  Substance Use Topics  . Alcohol use: Yes    Comment: sporadic     FAMILY HISTORY:    Family history reviewed and is non-contributory DRUG ALLERGIES:   Allergies  Allergen Reactions  . Sulfa Antibiotics     MEDICATIONS AT HOME:   Prior to Admission medications   Not on File    REVIEW OF SYSTEMS:  Review of Systems  Constitutional: Negative for chills, fever, malaise/fatigue and weight loss.  HENT: Negative for ear pain, hearing loss and tinnitus.   Eyes: Negative for blurred vision, double vision, pain and redness.  Respiratory: Negative for cough, hemoptysis and shortness of breath.   Cardiovascular: Negative for chest pain, palpitations, orthopnea and leg swelling.  Gastrointestinal: Negative for abdominal pain, constipation, diarrhea, nausea and vomiting.   Genitourinary: Negative for dysuria, frequency and hematuria.  Musculoskeletal: Positive for joint pain (right hip). Negative for back pain and neck pain.  Skin:       No acne, rash, or lesions  Neurological: Negative for dizziness, tremors, focal weakness and weakness.  Endo/Heme/Allergies: Negative for polydipsia. Does not bruise/bleed easily.  Psychiatric/Behavioral: Negative for depression. The patient is not nervous/anxious and does not have insomnia.      VITAL SIGNS:   Vitals:   01/02/19 2313 01/02/19 2315  BP: (!) 153/131   Pulse: (!) 102   Temp: 98.6 F (37 C)   TempSrc: Oral   SpO2: 93%   Weight:  123 kg  Height:  5\' 6"  (1.676 m)   Wt Readings from Last 3 Encounters:  01/02/19 123 kg    PHYSICAL EXAMINATION:  Physical Exam  Vitals reviewed. Constitutional: He is oriented to person, place, and time. He appears well-developed and well-nourished. No distress.  HENT:  Head: Normocephalic and atraumatic.  Mouth/Throat: Oropharynx is clear and moist.  Eyes: Pupils are equal, round, and reactive to light. Conjunctivae and EOM are normal. No scleral icterus.  Neck: Normal range of motion. Neck supple. No JVD present. No thyromegaly present.  Cardiovascular: Normal rate, regular rhythm and intact distal pulses. Exam reveals no gallop and no friction rub.  No murmur heard. Respiratory: Effort normal and breath sounds normal. No respiratory distress. He has no wheezes. He has no rales.  GI: Soft. Bowel  sounds are normal. He exhibits no distension. There is no abdominal tenderness.  Musculoskeletal:        General: Tenderness (right hip) present. No edema.     Comments: No arthritis, no gout  Lymphadenopathy:    He has no cervical adenopathy.  Neurological: He is alert and oriented to person, place, and time. No cranial nerve deficit.  No dysarthria, no aphasia  Skin: Skin is warm and dry. No rash noted. No erythema.  Psychiatric: He has a normal mood and affect. His  behavior is normal. Judgment and thought content normal.    LABORATORY PANEL:   CBC Recent Labs  Lab 01/02/19 2325  WBC 8.7  HGB 11.7*  HCT 34.2*  PLT 202   ------------------------------------------------------------------------------------------------------------------  Chemistries  Recent Labs  Lab 01/02/19 2325  NA 130*  K 4.0  CL 92*  CO2 25  GLUCOSE 85  BUN 8  CREATININE 0.56*  CALCIUM 8.6*  AST 53*  ALT 56*  ALKPHOS 175*  BILITOT 0.6   ------------------------------------------------------------------------------------------------------------------  Cardiac Enzymes Recent Labs  Lab 01/02/19 2325  TROPONINI <0.03   ------------------------------------------------------------------------------------------------------------------  RADIOLOGY:  Dg Chest 1 View  Result Date: 01/02/2019 CLINICAL DATA:  Post fall. Right hip fracture. Preop. EXAM: CHEST  1 VIEW COMPARISON:  Radiograph 08/26/2008 FINDINGS: The lungs are hyperinflated. Heart is normal in size. Aortic tortuosity and atherosclerosis. Multiple skin folds project over the right hemithorax and axilla. No focal airspace disease, pulmonary edema, large pleural effusion or pneumothorax. No acute osseous abnormalities are seen. IMPRESSION: 1. Hyperinflation without acute abnormality. 2. Aortic tortuosity and atherosclerosis. 3. Multiple skin folds project over the right hemithorax and axilla. Electronically Signed   By: Keith Rake M.D.   On: 01/02/2019 23:56   Dg Hip Unilat With Pelvis 2-3 Views Right  Result Date: 01/02/2019 CLINICAL DATA:  Fall with right hip pain. EXAM: DG HIP (WITH OR WITHOUT PELVIS) 2-3V RIGHT COMPARISON:  None. FINDINGS: Displaced intertrochanteric right femur fracture involves both greater and lesser trochanters. Mild rotational component. The femoral head remains seated. The remainder the pelvis is intact. No additional acute fracture. Bones are under mineralized. Pubic symphysis  and sacroiliac joints are congruent. Surgical sutures project over the lower abdomen. Chain sutures in the pelvis. IMPRESSION: Displaced intertrochanteric right femur fracture. Electronically Signed   By: Keith Rake M.D.   On: 01/02/2019 23:54    EKG:   Orders placed or performed during the hospital encounter of 01/02/19  . ED EKG  . ED EKG    IMPRESSION AND PLAN:  Principal Problem:   Closed right hip fracture ([place]) -right hip fracture confirmed by imaging in the ED.  Orthopedic surgery consult.  PRN analgesia Active Problems:   COPD (chronic obstructive pulmonary disease) ([place]) -continue home meds once med rec is complete  Chart review performed and case discussed with ED provider. Labs, imaging and/or ECG reviewed by provider and discussed with patient/family. Management plans discussed with the patient and/or family.  COVID-19 status: Test pending  DVT PROPHYLAXIS: Mechanical only  GI PROPHYLAXIS:  None  ADMISSION STATUS: Inpatient     CODE STATUS: Full  TOTAL TIME TAKING CARE OF THIS PATIENT: 45 minutes.   This patient was evaluated in the context of the global COVID-19 pandemic, which necessitated consideration that the patient might be at risk for infection with the SARS-CoV-2 virus that causes COVID-19. Institutional protocols and algorithms that pertain to the evaluation of patients at risk for COVID-19 are in a state of rapid change based  on information released by regulatory bodies including the CDC and federal and state organizations. These policies and algorithms were followed to the best of this provider's knowledge to date during the patient's care at this facility.  Ethlyn Daniels 01/03/2019, 12:06 AM  [facility]  Office  [phone]  CC: Primary care physician; No primary care provider on file.  Note:  This document was prepared using Dragon voice recognition software and may include unintentional dictation errors.

## 2019-01-03 NOTE — Progress Notes (Signed)
[facility] at [place] NAME: Darren Coleman    MR#:  [id number]  DATE OF BIRTH:  [birth year]  SUBJECTIVE:  CHIEF COMPLAINT:   Chief Complaint  Patient presents with  . Hip Pain   -Significant right leg pain due to fall and fracture.  Going for surgery today.  REVIEW OF SYSTEMS:  Review of Systems  Constitutional: Positive for malaise/fatigue. Negative for chills and fever.  HENT: Negative for congestion, hearing loss and nosebleeds.   Eyes: Negative for blurred vision and double vision.  Respiratory: Positive for shortness of breath. Negative for cough and wheezing.   Cardiovascular: Negative for chest pain and palpitations.  Gastrointestinal: Negative for abdominal pain, constipation, diarrhea, nausea and vomiting.  Genitourinary: Negative for dysuria.  Musculoskeletal: Positive for joint pain and myalgias.  Neurological: Negative for dizziness, focal weakness, seizures, weakness and headaches.  Psychiatric/Behavioral: Negative for depression.    DRUG ALLERGIES:   Allergies  Allergen Reactions  . Sulfa Antibiotics     VITALS:  Blood pressure (!) 155/91, pulse (!) 104, temperature 99 F (37.2 C), resp. rate 18, height 5\' 6"  (1.676 m), weight 64.9 kg, SpO2 98 %.  PHYSICAL EXAMINATION:  Physical Exam   GENERAL:  68 y.o.-year-old patient lying in the bed with no acute distress.  EYES: Pupils equal, round, reactive to light and accommodation. No scleral icterus. Extraocular muscles intact.  HEENT: Head atraumatic, normocephalic. Oropharynx and nasopharynx clear.  NECK:  Supple, no jugular venous distention. No thyroid enlargement, no tenderness.  LUNGS: Scant breath sounds bilaterally, occasional rhonchi, no wheezing, rales or crepitation. No use of accessory muscles of respiration.  CARDIOVASCULAR: S1, S2 normal. No murmurs, rubs, or gallops.  ABDOMEN: Soft, nontender, nondistended. Bowel sounds present. No organomegaly or mass.   EXTREMITIES: No pedal edema, cyanosis, or clubbing.  NEUROLOGIC: Cranial nerves II through XII are intact. Muscle strength 5/5 in all extremities except right lower extremity due to significant pain. Sensation intact. Gait not checked.  PSYCHIATRIC: The patient is alert and oriented x 3.  SKIN: No obvious rash, lesion, or ulcer.    LABORATORY PANEL:   CBC Recent Labs  Lab 01/03/19 0451  WBC 17.9*  HGB 10.1*  HCT 29.8*  PLT 198   ------------------------------------------------------------------------------------------------------------------  Chemistries  Recent Labs  Lab 01/02/19 2325 01/03/19 0451  NA 130* 132*  K 4.0 3.6  CL 92* 93*  CO2 25 29  GLUCOSE 85 96  BUN 8 9  CREATININE 0.56* 0.51*  CALCIUM 8.6* 8.5*  AST 53*  --   ALT 56*  --   ALKPHOS 175*  --   BILITOT 0.6  --    ------------------------------------------------------------------------------------------------------------------  Cardiac Enzymes Recent Labs  Lab 01/02/19 2325  TROPONINI <0.03   ------------------------------------------------------------------------------------------------------------------  RADIOLOGY:  Dg Chest 1 View  Result Date: 01/02/2019 CLINICAL DATA:  Post fall. Right hip fracture. Preop. EXAM: CHEST  1 VIEW COMPARISON:  Radiograph 08/26/2008 FINDINGS: The lungs are hyperinflated. Heart is normal in size. Aortic tortuosity and atherosclerosis. Multiple skin folds project over the right hemithorax and axilla. No focal airspace disease, pulmonary edema, large pleural effusion or pneumothorax. No acute osseous abnormalities are seen. IMPRESSION: 1. Hyperinflation without acute abnormality. 2. Aortic tortuosity and atherosclerosis. 3. Multiple skin folds project over the right hemithorax and axilla. Electronically Signed   By: Keith Rake M.D.   On: 01/02/2019 23:56   Dg Hip Unilat With Pelvis 2-3 Views Right  Result Date: 01/02/2019 CLINICAL DATA:  Fall with  right hip  pain. EXAM: DG HIP (WITH OR WITHOUT PELVIS) 2-3V RIGHT COMPARISON:  None. FINDINGS: Displaced intertrochanteric right femur fracture involves both greater and lesser trochanters. Mild rotational component. The femoral head remains seated. The remainder the pelvis is intact. No additional acute fracture. Bones are under mineralized. Pubic symphysis and sacroiliac joints are congruent. Surgical sutures project over the lower abdomen. Chain sutures in the pelvis. IMPRESSION: Displaced intertrochanteric right femur fracture. Electronically Signed   By: Keith Rake M.D.   On: 01/02/2019 23:54    EKG:   Orders placed or performed during the hospital encounter of 01/02/19  . ED EKG  . ED EKG    ASSESSMENT AND PLAN:   68 year old male with past medical history significant for COPD on 3 L chronic home oxygen presents to the emergency room secondary to fall and right hip pain.  1.  Right hip intertrochanteric fracture-admitted -Diabetes consulted.  For surgery today -Continue pain management -Postop DVT prophylaxis and physical therapy  2.  Leukocytosis-likely reactive.  If not improving, check urine analysis  3.  Chronic respiratory failure-secondary to COPD on home oxygen.  Uses mostly at night times.  Here 2 to 3 L continuous use noted. -Continue inhalers, neb treatments and monitor.  No indication for systemic steroids  4.  Tobacco use disorder-counseled and started on nicotine patch  5.  DVT prophylaxis-will be started after surgery   All the records are reviewed and case discussed with Care Management/Social Workerr. Management plans discussed with the patient, family and they are in agreement.  CODE STATUS: Full code  TOTAL TIME TAKING CARE OF THIS PATIENT: 38 minutes.   POSSIBLE D/C IN 1-2 DAYS, DEPENDING ON CLINICAL CONDITION.   Gladstone Lighter M.D on 01/03/2019 at 1:15 PM  Between 7am to 6pm - Pager - [phone]  After 6pm go to [email or web address] - password [email or web address]  [facility]  Office  [phone]  CC: Primary care physician; System, Pcp Not In

## 2019-01-03 NOTE — Op Note (Signed)
01/03/2019  10:06 PM  Patient:   Darren Coleman  Pre-Op Diagnosis:   Closed displaced 2 part intertrochanteric fracture, right hip.  Post-Op Diagnosis:   Same  Procedure:   Reduction and internal fixation of displaced intertrochanteric right hip fracture with Biomet Affixis TFN nail.  Surgeon:   Pascal Lux, MD  Assistant:   None  Anesthesia:   Spinal  Findings:   As above  Complications:   None  EBL:   20 cc  Fluids:   500 cc crystalloid  UOP:   100 cc  TT:   None  Drains:   None  Closure:   Staples  Implants:   Biomet Affixis 11 x 400 mm TFN with a 95 mm lag screw and a 50 mm distal interlocking screw  Brief Clinical Note:   The patient is a 68 year old male who sustained the above-noted injury last evening when his feet got caught up in a blanket around his chair and he fell, injuring his right hip. He presented to the emergency room where x-rays demonstrated the above-noted injury. The patient has been cleared medically and presents at this time for reduction and internal fixation of the displaced intertrochanteric right hip fracture.  Procedure:   The patient was brought into the operating room. After adequate spinal anesthesia was obtained, the patient was lain in the supine position on the fracture table. The uninjured leg was placed in a flexed and abducted position while the injured lower extremity was placed in longitudinal traction. The fracture was reduced using longitudinal traction and internal rotation. The adequacy of reduction was verified fluoroscopically in AP and lateral projections and found to be near anatomic. The lateral aspects of the right hip and thigh were prepped with ChloraPrep solution before being draped sterilely. Preoperative antibiotics were administered. A timeout was performed to verify the appropriate surgical site. The greater trochanter was identified fluoroscopically and an approximately 3 cm incision made about 2-3 fingerbreadths  above the tip of the greater trochanter. The incision was carried down through the subcutaneous tissues to expose the gluteal fascia. This was split the length of the incision, providing access to the tip of the trochanter.   Under fluoroscopic guidance, a guidewire was drilled through the tip of the trochanter into the proximal metaphysis to the level of the lesser trochanter. After verifying its position fluoroscopically in AP and lateral projections, it was overreamed with the initial reamer to the depth of the lesser trochanter. A guidewire was passed down through the femoral canal to the supracondylar region. The adequacy of guidewire position was verified fluoroscopically in AP and lateral projections before the length of the guidewire within the canal was measured and found to be 410 mm. Therefore, a 400 mm length nail was selected. The guidewire was overreamed sequentially using the flexible reamers, beginning with a 9.5 mm reamer and progressing to a 12.5 mm reamer. This provided good cortical chatter. The 11 x 400 mm Biomet Affixis TFN rod was selected and advanced to the appropriate depth, as verified fluoroscopically.   The guide system for the lag screw was positioned and advanced through an approximately 2 cm stab incision over the lateral aspect of the proximal femur. The guidewire was drilled up through the trochanteric femoral nail and into the femoral neck to rest within 5 mm of subchondral bone. After verifying its position in the femoral neck and head in both AP and lateral projections, the guidewire was measured and found to be optimally replicated by  a 95 mm lag screw. The guidewire was overreamed to the appropriate depth before the lag screw was inserted and advanced to the appropriate depth as verified fluoroscopically in AP and lateral projections. Compression was applied across the fracture utilizing the appropriate compression device under fluoroscopic guidance after loosening tension  on the leg. The locking screw was advanced, then backed off a quarter turn to set the lag screw. Again the adequacy of hardware position and fracture reduction was verified fluoroscopically in AP and lateral projections and found to be excellent.  Attention was directed distally. Using the "perfect circle" technique, the leg and fluoroscopy machine were positioned appropriately. An approximately 1.5 cm stab incision was made over the skin at the appropriate point before the drill bit was advanced through the cortex and across the static hole of the nail. The appropriate length of the screw was determined before the 50 mm distal interlocking screw was positioned, then advanced and tightened securely. Again the adequacy of screw position was verified fluoroscopically in AP and lateral projections and found to be excellent.  The wounds were irrigated thoroughly with sterile saline solution before the abductor fascia was reapproximated using #1 Vicryl interrupted sutures. The subcutaneous tissues were closed using 2-0 Vicryl interrupted sutures. The skin was closed using staples. A total of 30 cc of 0.5% Sensorcaine with epinephrine was injected in and around all incisions. Sterile occlusive dressings were applied to all wounds before the patient was transferred back to his/her hospital bed. The patient was then transferred to the recovery room in satisfactory condition after tolerating the procedure well.

## 2019-01-03 NOTE — TOC Initial Note (Signed)
Transition of Care [facility]) - Initial/Assessment Note    Patient Details  Name: Darren Coleman MRN: [id number] Date of Birth: 08-04-1950  [facility]) CM/SW Contact:    Laneta Guerin, Lenice Llamas Phone Number: [phone]  01/03/2019, 4:46 PM  Clinical Narrative: Clinical Social Worker (CSW) received consult for SNF placement. Per chart patient has a hip fracture and surgery and PT are pending. CSW met with patient alone at bedside prior to surgery today. Patient was alert and oriented X4 and was laying in the bed. CSW introduced self and explained role of CSW department. Per patient he lives in [place] with his wife Roland Rack and prefers to go back home after surgery. CSW explained that PT will evaluate him after surgery and make a recommendation of home health or SNF. Patient prefers to D/C home. CSW will continue to follow and assist as needed.                 Expected Discharge Plan: [place] Barriers to Discharge: Continued Medical Work up   Patient Goals and CMS Choice Patient states their goals for this hospitalization and ongoing recovery are:: Pain control.      Expected Discharge Plan and Services Expected Discharge Plan: [place] In-house Referral: Clinical Social Work Discharge Planning Services: CM Consult   Living arrangements for the past 2 months: [place]                                      Prior Living Arrangements/Services Living arrangements for the past 2 months: Single Family Home Lives with:: Spouse Patient language and need for interpreter reviewed:: No Do you feel safe going back to the place where you live?: Yes      Need for Family Participation in Patient Care: Yes (Comment) Care giver support system in place?: Yes (comment)   Criminal Activity/Legal Involvement Pertinent to Current Situation/Hospitalization: No - Comment as needed  Activities of Daily Living Home Assistive  Devices/Equipment: Oxygen ADL Screening (condition at time of admission) Patient's cognitive ability adequate to safely complete daily activities?: Yes Is the patient deaf or have difficulty hearing?: No Does the patient have difficulty seeing, even when wearing glasses/contacts?: No Does the patient have difficulty concentrating, remembering, or making decisions?: No Patient able to express need for assistance with ADLs?: Yes Does the patient have difficulty dressing or bathing?: No Independently performs ADLs?: Yes (appropriate for developmental age) Does the patient have difficulty walking or climbing stairs?: No Weakness of Legs: Right Weakness of Arms/Hands: None  Permission Sought/Granted Permission sought to share information with : Family Supports Permission granted to share information with : Yes, Verbal Permission Granted              Emotional Assessment Appearance:: Appears stated age   Affect (typically observed): Calm Orientation: : Oriented to Self, Oriented to Place, Oriented to  Time, Oriented to Situation Alcohol / Substance Use: Not Applicable Psych Involvement: No (comment)  Admission diagnosis:  Right hip pain [M25.551] Fall, initial encounter [W19.XXXA] Closed displaced intertrochanteric fracture of right femur, initial encounter [facility]) [S72.141A] Patient Active Problem List   Diagnosis Date Noted  . Closed right hip fracture ([place]) 01/03/2019  . COPD (chronic obstructive pulmonary disease) ([place]) 01/03/2019   PCP:  System, Pcp Not In Pharmacy:   [id number] [place] [place] [id number]  [place] [address] Phone: [phone] Fax: [phone]     Social Determinants of Health (SDOH) Interventions    Readmission Risk Interventions No flowsheet data found.

## 2019-01-03 NOTE — ED Notes (Signed)
ED TO INPATIENT HANDOFF REPORT  ED Nurse Name and Phone #: Tessie Fass [phone]  S Name/Age/Gender Darren Coleman 68 y.o. male Room/Bed: ED10A/ED10A  Code Status   Code Status: Not on file  Home/SNF/Other Home Patient oriented to: self, place, time and situation Is this baseline? Yes   Triage Complete: Triage complete  Chief Complaint [facility] - Fall, right hip pain  Triage Note Mechanical fall, right hip pain, right foot rotation. Pt states he became tangled in a blanket when he got up from his chair. No dizziness. Hx of compression fractures in his back. No surgeries. Pt is on 2L of O2 at night.    Allergies Allergies  Allergen Reactions  . Sulfa Antibiotics     Level of Care/Admitting Diagnosis ED Disposition    ED Disposition Condition [facility] Area: [place] [[id number]]  Level of Care: Med-Surg [16]  Covid Evaluation: Screening Protocol (No Symptoms)  Diagnosis: Closed right hip fracture [facility]) [[id number]]  Admitting Physician: Lance Coon [[id number]]  Attending Physician: Lance Coon [phone]  Estimated length of stay: past midnight tomorrow  Certification:: I certify this patient will need inpatient services for at least 2 midnights  PT Class (Do Not Modify): Inpatient [101]  PT Acc Code (Do Not Modify): Private [1]       B Medical/Surgery History Past Medical History:  Diagnosis Date  . Cancer ([place])   . COPD (chronic obstructive pulmonary disease) ([place])    Past Surgical History:  Procedure Laterality Date  . COLON SURGERY       A IV Location/Drains/Wounds Patient Lines/Drains/Airways Status   Active Line/Drains/Airways    Name:   Placement date:   Placement time:   Site:   Days:   Peripheral IV 01/02/19 Right Forearm   01/02/19    2328    Forearm   1   Peripheral IV 01/03/19 Left Forearm   01/03/19    0028    Forearm   less than 1          Intake/Output Last 24 hours No intake or output data in the 24  hours ending 01/03/19 0057  Labs/Imaging Results for orders placed or performed during the hospital encounter of 01/02/19 (from the past 48 hour(s))  CBC with Differential     Status: Abnormal   Collection Time: 01/02/19 11:25 PM  Result Value Ref Range   WBC 8.7 4.0 - 10.5 K/uL   RBC 3.43 (L) 4.22 - 5.81 MIL/uL   Hemoglobin 11.7 (L) 13.0 - 17.0 g/dL   HCT 34.2 (L) 39.0 - 52.0 %   MCV 99.7 80.0 - 100.0 fL   MCH 34.1 (H) 26.0 - 34.0 pg   MCHC 34.2 30.0 - 36.0 g/dL   RDW 12.1 11.5 - 15.5 %   Platelets 202 150 - 400 K/uL   nRBC 0.0 0.0 - 0.2 %   Neutrophils Relative % 64 %   Neutro Abs 5.6 1.7 - 7.7 K/uL   Lymphocytes Relative 20 %   Lymphs Abs 1.8 0.7 - 4.0 K/uL   Monocytes Relative 10 %   Monocytes Absolute 0.9 0.1 - 1.0 K/uL   Eosinophils Relative 4 %   Eosinophils Absolute 0.3 0.0 - 0.5 K/uL   Basophils Relative 1 %   Basophils Absolute 0.1 0.0 - 0.1 K/uL   Immature Granulocytes 1 %   Abs Immature Granulocytes 0.09 (H) 0.00 - 0.07 K/uL    Comment: Performed at [facility], [address] [place], [place] [address]  Comprehensive metabolic panel     Status: Abnormal   Collection Time: 01/02/19 11:25 PM  Result Value Ref Range   Sodium 130 (L) 135 - 145 mmol/L   Potassium 4.0 3.5 - 5.1 mmol/L   Chloride 92 (L) 98 - 111 mmol/L   CO2 25 22 - 32 mmol/L   Glucose, Bld 85 70 - 99 mg/dL   BUN 8 8 - 23 mg/dL   Creatinine, Ser 0.56 (L) 0.61 - 1.24 mg/dL   Calcium 8.6 (L) 8.9 - 10.3 mg/dL   Total Protein 6.5 6.5 - 8.1 g/dL   Albumin 3.6 3.5 - 5.0 g/dL   AST 53 (H) 15 - 41 U/L   ALT 56 (H) 0 - 44 U/L   Alkaline Phosphatase 175 (H) 38 - 126 U/L   Total Bilirubin 0.6 0.3 - 1.2 mg/dL   GFR calc non Af Amer >60 >60 mL/min   GFR calc Af Amer >60 >60 mL/min   Anion gap 13 5 - 15    Comment: Performed at [facility], [address] [place], [place] [address]  Troponin I - Once     Status: None   Collection Time: 01/02/19 11:25 PM  Result Value Ref Range    Troponin I <0.03 <0.03 ng/mL    Comment: Performed at [facility], [address] [place], [place] [address]  Type and screen Ordered by PROVIDER DEFAULT     Status: None (Preliminary result)   Collection Time: 01/02/19 11:26 PM  Result Value Ref Range   ABO/RH(D) PENDING    Antibody Screen PENDING    Sample Expiration      01/05/2019,2359 Performed at [facility], [address] [place], [place] [address]   Type and screen [place]     Status: None (Preliminary result)   Collection Time: 01/02/19 11:29 PM  Result Value Ref Range   ABO/RH(D) PENDING    Antibody Screen PENDING    Sample Expiration      01/05/2019,2359 Performed at [facility], [address], [place], [place] [address]    Dg Chest 1 View  Result Date: 01/02/2019 CLINICAL DATA:  Post fall. Right hip fracture. Preop. EXAM: CHEST  1 VIEW COMPARISON:  Radiograph 08/26/2008 FINDINGS: The lungs are hyperinflated. Heart is normal in size. Aortic tortuosity and atherosclerosis. Multiple skin folds project over the right hemithorax and axilla. No focal airspace disease, pulmonary edema, large pleural effusion or pneumothorax. No acute osseous abnormalities are seen. IMPRESSION: 1. Hyperinflation without acute abnormality. 2. Aortic tortuosity and atherosclerosis. 3. Multiple skin folds project over the right hemithorax and axilla. Electronically Signed   By: Keith Rake M.D.   On: 01/02/2019 23:56   Dg Hip Unilat With Pelvis 2-3 Views Right  Result Date: 01/02/2019 CLINICAL DATA:  Fall with right hip pain. EXAM: DG HIP (WITH OR WITHOUT PELVIS) 2-3V RIGHT COMPARISON:  None. FINDINGS: Displaced intertrochanteric right femur fracture involves both greater and lesser trochanters. Mild rotational component. The femoral head remains seated. The remainder the pelvis is intact. No additional acute fracture. Bones are under mineralized. Pubic symphysis and sacroiliac joints are  congruent. Surgical sutures project over the lower abdomen. Chain sutures in the pelvis. IMPRESSION: Displaced intertrochanteric right femur fracture. Electronically Signed   By: Keith Rake M.D.   On: 01/02/2019 23:54    Pending Labs Unresulted Labs (From admission, onward)    Start     Ordered   01/03/19 0030  Type and screen Ordered by PROVIDER DEFAULT  Once,   STAT     01/03/19 0030   01/02/19 2347  Protime-INR  Once,   STAT     01/02/19 2346   01/02/19 2346  SARS Coronavirus 2 (CEPHEID - Performed in [place] hospital lab), Hosp Order  (Asymptomatic Patients Labs)  Once,   STAT    Question:  Rule Out  Answer:  Yes   01/02/19 2346   Signed and Held  HIV antibody (Routine Testing)  Once,   R     Signed and Held   Signed and Held  Basic metabolic panel  Tomorrow morning,   R     Signed and Held   Signed and Held  CBC  Tomorrow morning,   R     Signed and Held          Vitals/Pain Today's Vitals   01/02/19 2315 01/03/19 0015 01/03/19 0026 01/03/19 0030  BP:    (!) 150/92  Pulse:  92  98  Resp:  19  (!) 25  Temp:      TempSrc:      SpO2:  99%  99%  Weight: 123 kg     Height: 5\' 6"  (1.676 m)     PainSc: 10-Worst pain ever  9      Isolation Precautions No active isolations  Medications Medications  methocarbamol (ROBAXIN) 750 mg in dextrose 5 % 50 mL IVPB (750 mg Intravenous New Bag/Given 01/03/19 0047)  ceFAZolin (ANCEF) IVPB 2g/100 mL premix (has no administration in time range)  morphine 4 MG/ML injection 4 mg (4 mg Intravenous Given 01/02/19 2325)  ondansetron (ZOFRAN) injection 4 mg (4 mg Intravenous Given 01/02/19 2325)  HYDROmorphone (DILAUDID) injection 0.5 mg (0.5 mg Intravenous Given 01/03/19 0005)    Mobility non-ambulatory Moderate fall risk   Focused Assessments NA   R Recommendations: See Admitting Provider Note  Report given to:   Additional Notes: .

## 2019-01-03 NOTE — NC FL2 (Signed)
[place] LEVEL OF CARE SCREENING TOOL     IDENTIFICATION  Patient Name: Darren Coleman Birthdate: [birth year] Sex: male Admission Date (Current Location): 01/02/2019  [facility] and [place] Number:  Engineering geologist and Address:         Provider Number: [id number]  Attending Physician Name and Address:  Gladstone Lighter, MD  Relative Name and Phone Number:       Current Level of Care: Hospital Recommended Level of Care: [place] Prior Approval Number:    Date Approved/Denied:   PASRR Number: [id number]  Discharge Plan: SNF    Current Diagnoses: Patient Active Problem List   Diagnosis Date Noted  . Closed right hip fracture ([place]) 01/03/2019  . COPD (chronic obstructive pulmonary disease) ([place]) 01/03/2019    Orientation RESPIRATION BLADDER Height & Weight     Self, Time, Situation, Place  Normal Continent Weight: 143 lb (64.9 kg) Height:  5\' 6"  (167.6 cm)  BEHAVIORAL SYMPTOMS/MOOD NEUROLOGICAL BOWEL NUTRITION STATUS      Continent Diet(Diet: NPO for surgery to be advanced.)  AMBULATORY STATUS COMMUNICATION OF NEEDS Skin   Extensive Assist Verbally Surgical wounds                       Personal Care Assistance Level of Assistance  Bathing, Feeding, Dressing Bathing Assistance: Limited assistance Feeding assistance: Independent Dressing Assistance: Limited assistance     Functional Limitations Info  Sight, Hearing, Speech Sight Info: Adequate Hearing Info: Adequate Speech Info: Adequate    SPECIAL CARE FACTORS FREQUENCY  PT (By licensed PT), OT (By licensed OT)     PT Frequency: 5 OT Frequency: 5            Contractures      Additional Factors Info  Code Status, Allergies Code Status Info: Full Code. Allergies Info: Sulfa Antibiotics           Current Medications (01/03/2019):  This is the current hospital active medication list Current Facility-Administered Medications  Medication Dose Route  Frequency Provider Last Rate Last Dose  . acetaminophen (TYLENOL) tablet 650 mg  650 mg Oral Q6H PRN Lance Coon, MD       Or  . acetaminophen (TYLENOL) suppository 650 mg  650 mg Rectal Q6H PRN Lance Coon, MD      . albuterol (PROVENTIL) (2.5 MG/3ML) 0.083% nebulizer solution 2.5 mg  2.5 mg Nebulization Q2H PRN Gladstone Lighter, MD      . ceFAZolin (ANCEF) IVPB 2g/100 mL premix  2 g Intravenous Once Poggi, Marshall Cork, MD      . HYDROmorphone (DILAUDID) injection 1 mg  1 mg Intravenous Q4H PRN Harrie Foreman, MD   1 mg at 01/03/19 1419  . ipratropium-albuterol (DUONEB) 0.5-2.5 (3) MG/3ML nebulizer solution 3 mL  3 mL Nebulization TID Gladstone Lighter, MD   3 mL at 01/03/19 1419  . morphine 2 MG/ML injection 2 mg  2 mg Intravenous Q4H PRN Harrie Foreman, MD   2 mg at 01/03/19 0423  . nicotine (NICODERM CQ - dosed in mg/24 hours) patch 14 mg  14 mg Transdermal Daily Gladstone Lighter, MD   14 mg at 01/03/19 1047  . ondansetron (ZOFRAN) tablet 4 mg  4 mg Oral Q6H PRN Lance Coon, MD       Or  . ondansetron [facility]) injection 4 mg  4 mg Intravenous Q6H PRN Lance Coon, MD      . oxyCODONE (Oxy IR/ROXICODONE) immediate release tablet 5  mg  5 mg Oral Q4H PRN Harrie Foreman, MD   5 mg at 01/03/19 1613     Discharge Medications: Please see discharge summary for a list of discharge medications.  Relevant Imaging Results:  Relevant Lab Results:   Additional Information SSN: [id number]  Christopherjohn Schiele, Veronia Beets, LCSW

## 2019-01-03 NOTE — Consult Note (Signed)
ORTHOPAEDIC CONSULTATION  REQUESTING PHYSICIAN: Gladstone Lighter, MD  Chief Complaint:   Right hip pain.  History of Present Illness: Darren Coleman is a 68 y.o. male with a history of COPD who lives independently at home.  Apparently, last evening, while trying to get out of his chair, he is foot became tangled up with the blanket on his chair and he fell on his right side, injuring his right hip.  He presented to the emergency room where x-rays demonstrated a displaced intertrochanteric fracture.  He has been admitted to the hospitalist service in preparation for definitive management of this injury.  The patient denies any associated injuries.  He did not strike his head or lose consciousness.  In addition, he denies any lightheadedness, dizziness, chest pain, shortness of breath, or other symptoms which may have precipitated his fall.  Past Medical History:  Diagnosis Date  . Cancer ([place])   . COPD (chronic obstructive pulmonary disease) ([place])    Past Surgical History:  Procedure Laterality Date  . COLON SURGERY     Social History   Socioeconomic History  . Marital status: Married    Spouse name: Not on file  . Number of children: Not on file  . Years of education: Not on file  . Highest education level: Not on file  Occupational History  . Not on file  Social Needs  . Financial resource strain: Not on file  . Food insecurity    Worry: Not on file    Inability: Not on file  . Transportation needs    Medical: Not on file    Non-medical: Not on file  Tobacco Use  . Smoking status: Current Every Day Smoker    Packs/day: 0.50    Types: Cigarettes  . Smokeless tobacco: Never Used  Substance and Sexual Activity  . Alcohol use: Yes    Comment: sporadic  . Drug use: Never  . Sexual activity: Not Currently  Lifestyle  . Physical activity    Days per week: Not on file    Minutes per session: Not on file  .  Stress: Not on file  Relationships  . Social Herbalist on phone: Not on file    Gets together: Not on file    Attends religious service: Not on file    Active member of club or organization: Not on file    Attends meetings of clubs or organizations: Not on file    Relationship status: Not on file  Other Topics Concern  . Not on file  Social History Narrative  . Not on file   History reviewed. No pertinent family history. Allergies  Allergen Reactions  . Sulfa Antibiotics    Prior to Admission medications   Not on File   Dg Chest 1 View  Result Date: 01/02/2019 CLINICAL DATA:  Post fall. Right hip fracture. Preop. EXAM: CHEST  1 VIEW COMPARISON:  Radiograph 08/26/2008 FINDINGS: The lungs are hyperinflated. Heart is normal in size. Aortic tortuosity and atherosclerosis. Multiple skin folds project over the right hemithorax and axilla. No focal airspace disease, pulmonary edema, large pleural effusion or pneumothorax. No acute osseous abnormalities are seen. IMPRESSION: 1. Hyperinflation without acute abnormality. 2. Aortic tortuosity and atherosclerosis. 3. Multiple skin folds project over the right hemithorax and axilla. Electronically Signed   By: Keith Rake M.D.   On: 01/02/2019 23:56   Dg Hip Unilat With Pelvis 2-3 Views Right  Result Date: 01/02/2019 CLINICAL DATA:  Fall with right hip  pain. EXAM: DG HIP (WITH OR WITHOUT PELVIS) 2-3V RIGHT COMPARISON:  None. FINDINGS: Displaced intertrochanteric right femur fracture involves both greater and lesser trochanters. Mild rotational component. The femoral head remains seated. The remainder the pelvis is intact. No additional acute fracture. Bones are under mineralized. Pubic symphysis and sacroiliac joints are congruent. Surgical sutures project over the lower abdomen. Chain sutures in the pelvis. IMPRESSION: Displaced intertrochanteric right femur fracture. Electronically Signed   By: Keith Rake M.D.   On: 01/02/2019  23:54    Positive ROS: All other systems have been reviewed and were otherwise negative with the exception of those mentioned in the HPI and as above.  Physical Exam: General:  Alert, no acute distress Psychiatric:  Patient is competent for consent with normal mood and affect   Cardiovascular:  No pedal edema Respiratory:  No wheezing, non-labored breathing GI:  Abdomen is soft and non-tender Skin:  No lesions in the area of chief complaint Neurologic:  Sensation intact distally Lymphatic:  No axillary or cervical lymphadenopathy  Orthopedic Exam:  Orthopedic examination is limited to the right hip and lower extremity.  The patient's right lower extremity is somewhat shortened and externally rotated as compared to the left.  Skin inspection around the right hip is unremarkable.  No swelling, erythema, ecchymosis, abrasions, or other skin abnormalities are identified.  He has mild-moderate tenderness to palpation over the lateral aspect of the hip.  He has more severe pain with any attempted active or passive motion of the right hip or lower extremity.  He is able to dorsiflex and plantarflex his toes and ankle.  Sensation is intact to light touch to all distributions.  He has good capillary refill to his right foot.  X-rays:  X-rays of the pelvis and right hip are available for review.  These films demonstrate a displaced 2 part intertrochanteric fracture of the right hip.  No significant degenerative changes or lytic lesions are identified.  Assessment: Displaced intertrochanteric fracture, right hip.  Plan: The treatment options have been discussed in detail with the patient, including both surgical and nonsurgical choices.  The patient like to proceed with surgical intervention to include a reduction and intramedullary nailing of the displaced intertrochanteric fracture of the right hip.  This procedure has been discussed in detail, as have the potential risks (including bleeding,  infection, nerve and/or blood vessel injury, persistent or recurrent pain, stiffness, malunion and/or nonunion, need for further surgery, blood clots, strokes, heart attacks and/or arrhythmias, etc.) and benefits.  The patient states his understanding and wishes to proceed.  A formal written consent will be obtained by the nursing staff.  Thank you for asking me to participate in the care of this most pleasant yet unfortunate man.  I will be happy to follow him with you.   Pascal Lux, MD  Beeper #:  [phone]  01/03/2019 7:50 AM

## 2019-01-03 NOTE — ED Notes (Signed)
Patients skin tears to bilateral forearms  were cleaned and bandaged.

## 2019-01-03 NOTE — Anesthesia Preprocedure Evaluation (Addendum)
Anesthesia Evaluation  Patient identified by MRN, date of birth, ID band Patient awake    Reviewed: Allergy & Precautions, NPO status , Patient's Chart, lab work & pertinent test results  Airway Mallampati: II  TM Distance: >3 FB     Dental  (+) Teeth Intact   Pulmonary shortness of breath and Long-Term Oxygen Therapy, COPD,  COPD inhaler and oxygen dependent, neg recent URI, Current Smoker,  Pt has taken prednisone 10 mg daily and azithromycin daily for past year for management of COPD   + rhonchi  + decreased breath sounds  rales    Cardiovascular (-) angina(-) Past MI and (-) Cardiac Stents (-) dysrhythmias  Rhythm:regular Rate:Tachycardia     Neuro/Psych Pt reports h/o thoracic compression fractures last month after lifting something heavy.  Denies any neurologic injury - no numbness, paresthesias, or weakness in LE's.  Pt ambulates on his own. negative psych ROS   GI/Hepatic negative GI ROS, Neg liver ROS,   Endo/Other  negative endocrine ROS  Renal/GU negative Renal ROS  negative genitourinary   Musculoskeletal   Abdominal   Peds negative pediatric ROS (+)  Hematology negative hematology ROS (+)   Anesthesia Other Findings Past Medical History: No date: Cancer ([place]) No date: COPD (chronic obstructive pulmonary disease) ([place])  Care everywhere states that patient is on Toprol, Lasix, nebs, lipitor and asa  Reproductive/Obstetrics                         Anesthesia Physical Anesthesia Plan  ASA: III  Anesthesia Plan: Spinal   Post-op Pain Management:    Induction:   PONV Risk Score and Plan: Propofol infusion  Airway Management Planned:   Additional Equipment:   Intra-op Plan:   Post-operative Plan:   Informed Consent:   Plan Discussed with: Anesthesiologist and CRNA  Anesthesia Plan Comments:        Anesthesia Quick Evaluation

## 2019-01-03 NOTE — Transfer of Care (Signed)
Immediate Anesthesia Transfer of Care Note  Patient: Darren Coleman  Procedure(s) Performed: INTRAMEDULLARY (IM) NAIL INTERTROCHANTRIC (Right Hip)  Patient Location: PACU  Anesthesia Type:Spinal  Level of Consciousness: drowsy and patient cooperative  Airway & Oxygen Therapy: Patient Spontanous Breathing and Patient connected to nasal cannula oxygen  Post-op Assessment: Report given to RN and Post -op Vital signs reviewed and stable  Post vital signs: Reviewed and stable  Last Vitals:  Vitals Value Taken Time  BP 105/64 01/03/19 2210  Temp    Pulse 98 01/03/19 2210  Resp 26 01/03/19 2211  SpO2 97 % 01/03/19 2210  Vitals shown include unvalidated device data.  Last Pain:  Vitals:   01/03/19 1945  TempSrc:   PainSc: 10-Worst pain ever      Patients Stated Pain Goal: 1 (18/59/09 3112)  Complications: No apparent anesthesia complications

## 2019-01-03 NOTE — Anesthesia Post-op Follow-up Note (Signed)
Anesthesia QCDR form completed.        

## 2019-01-04 ENCOUNTER — Encounter: Payer: Self-pay | Admitting: Surgery

## 2019-01-04 LAB — BASIC METABOLIC PANEL WITH GFR
Anion gap: 12 (ref 5–15)
BUN: 9 mg/dL (ref 8–23)
CO2: 24 mmol/L (ref 22–32)
Calcium: 7.8 mg/dL — ABNORMAL LOW (ref 8.9–10.3)
Chloride: 95 mmol/L — ABNORMAL LOW (ref 98–111)
Creatinine, Ser: 0.5 mg/dL — ABNORMAL LOW (ref 0.61–1.24)
GFR calc Af Amer: >60 mL/min
GFR calc non Af Amer: >60 mL/min
Glucose, Bld: 110 mg/dL — ABNORMAL HIGH (ref 70–99)
Potassium: 3.7 mmol/L (ref 3.5–5.1)
Sodium: 131 mmol/L — ABNORMAL LOW (ref 135–145)

## 2019-01-04 LAB — HIV ANTIBODY (ROUTINE TESTING W REFLEX): HIV Screen 4th Generation wRfx: NONREACTIVE

## 2019-01-04 LAB — RETICULOCYTES
Immature Retic Fract: 8 % (ref 2.3–15.9)
RBC.: 2.27 MIL/uL — ABNORMAL LOW (ref 4.22–5.81)
Retic Count, Absolute: 31.3 K/uL (ref 19.0–186.0)
Retic Ct Pct: 1.4 % (ref 0.4–3.1)

## 2019-01-04 LAB — CBC
HCT: 23.4 % — ABNORMAL LOW (ref 39.0–52.0)
Hemoglobin: 7.9 g/dL — ABNORMAL LOW (ref 13.0–17.0)
MCH: 33.9 pg (ref 26.0–34.0)
MCHC: 33.8 g/dL (ref 30.0–36.0)
MCV: 100.4 fL — ABNORMAL HIGH (ref 80.0–100.0)
Platelets: 131 10*3/uL — ABNORMAL LOW (ref 150–400)
RBC: 2.33 MIL/uL — ABNORMAL LOW (ref 4.22–5.81)
RDW: 12.3 % (ref 11.5–15.5)
WBC: 15.4 10*3/uL — ABNORMAL HIGH (ref 4.0–10.5)
nRBC: 0 % (ref 0.0–0.2)

## 2019-01-04 LAB — IRON AND TIBC
Iron: 19 ug/dL — ABNORMAL LOW (ref 45–182)
Saturation Ratios: 9 % — ABNORMAL LOW (ref 17.9–39.5)
TIBC: 202 ug/dL — ABNORMAL LOW (ref 250–450)
UIBC: 183 ug/dL

## 2019-01-04 LAB — FERRITIN: Ferritin: 387 ng/mL — ABNORMAL HIGH (ref 24–336)

## 2019-01-04 LAB — VITAMIN B12: Vitamin B-12: 153 pg/mL — ABNORMAL LOW (ref 180–914)

## 2019-01-04 LAB — FOLATE: Folate: 9.3 ng/mL (ref >5.9)

## 2019-01-04 NOTE — Progress Notes (Signed)
[facility] NAME: Darren Coleman    MR#:  [id number]  DATE OF BIRTH:  August 02, 1950  SUBJECTIVE:  CHIEF COMPLAINT:   Chief Complaint  Patient presents with  . Hip Pain   -Postop day 1 after right hip surgery.  Still in significant pain today, requiring pain medications.  Appears comfortable though. -States that his breathing is much better after starting breathing treatments.  REVIEW OF SYSTEMS:  Review of Systems  Constitutional: Positive for malaise/fatigue. Negative for chills and fever.  HENT: Negative for congestion, hearing loss and nosebleeds.   Eyes: Negative for blurred vision and double vision.  Respiratory: Positive for shortness of breath. Negative for cough and wheezing.   Cardiovascular: Negative for chest pain and palpitations.  Gastrointestinal: Negative for abdominal pain, constipation, diarrhea, nausea and vomiting.  Genitourinary: Negative for dysuria.  Musculoskeletal: Positive for joint pain and myalgias.  Neurological: Negative for dizziness, focal weakness, seizures, weakness and headaches.  Psychiatric/Behavioral: Negative for depression.    DRUG ALLERGIES:   Allergies  Allergen Reactions  . Sulfa Antibiotics     VITALS:  Blood pressure 131/82, pulse 96, temperature 98.8 F (37.1 C), temperature source Oral, resp. rate 18, height 5\' 6"  (1.676 m), weight 64.9 kg, SpO2 100 %.  PHYSICAL EXAMINATION:  Physical Exam   GENERAL:  68 y.o.-year-old patient lying in the bed with no acute distress.  EYES: Pupils equal, round, reactive to light and accommodation. No scleral icterus. Extraocular muscles intact.  HEENT: Head atraumatic, normocephalic. Oropharynx and nasopharynx clear.  NECK:  Supple, no jugular venous distention. No thyroid enlargement, no tenderness.  LUNGS: Scant breath sounds bilaterally, occasional rhonchi, no wheezing, rales or crepitation. No use of accessory muscles of respiration.   CARDIOVASCULAR: S1, S2 normal. No murmurs, rubs, or gallops.  ABDOMEN: Soft, nontender, nondistended. Bowel sounds present. No organomegaly or mass.  EXTREMITIES: Right hip dressing in place.  No pedal edema, cyanosis, or clubbing.  NEUROLOGIC: Cranial nerves II through XII are intact. Muscle strength 5/5 in all extremities except right lower extremity due to significant pain. Sensation intact. Gait not checked.  PSYCHIATRIC: The patient is alert and oriented x 3.  SKIN: No obvious rash, lesion, or ulcer.    LABORATORY PANEL:   CBC Recent Labs  Lab 01/04/19 0427  WBC 15.4*  HGB 7.9*  HCT 23.4*  PLT 131*   ------------------------------------------------------------------------------------------------------------------  Chemistries  Recent Labs  Lab 01/02/19 2325  01/04/19 0427  NA 130*   < > 131*  K 4.0   < > 3.7  CL 92*   < > 95*  CO2 25   < > 24  GLUCOSE 85   < > 110*  BUN 8   < > 9  CREATININE 0.56*   < > 0.50*  CALCIUM 8.6*   < > 7.8*  AST 53*  --   --   ALT 56*  --   --   ALKPHOS 175*  --   --   BILITOT 0.6  --   --    < > = values in this interval not displayed.   ------------------------------------------------------------------------------------------------------------------  Cardiac Enzymes Recent Labs  Lab 01/02/19 2325  TROPONINI <0.03   ------------------------------------------------------------------------------------------------------------------  RADIOLOGY:  Dg Chest 1 View  Result Date: 01/02/2019 CLINICAL DATA:  Post fall. Right hip fracture. Preop. EXAM: CHEST  1 VIEW COMPARISON:  Radiograph 08/26/2008 FINDINGS: The lungs are hyperinflated. Heart is normal in size. Aortic tortuosity and atherosclerosis. Multiple skin  folds project over the right hemithorax and axilla. No focal airspace disease, pulmonary edema, large pleural effusion or pneumothorax. No acute osseous abnormalities are seen. IMPRESSION: 1. Hyperinflation without acute  abnormality. 2. Aortic tortuosity and atherosclerosis. 3. Multiple skin folds project over the right hemithorax and axilla. Electronically Signed   By: Keith Rake M.D.   On: 01/02/2019 23:56   Dg Hip Operative Unilat W Or W/o Pelvis Right  Result Date: 01/03/2019 CLINICAL DATA:  Right hip fracture EXAM: OPERATIVE right HIP (WITH PELVIS IF PERFORMED) 8 VIEWS TECHNIQUE: Fluoroscopic spot image(s) were submitted for interpretation post-operatively. COMPARISON:  01/02/2019 FINDINGS: Eight low resolution intraoperative spot views of the right hip. Total fluoroscopy time was 1 minutes 6 seconds. Initial images demonstrate right intertrochanteric fracture. Subsequent images demonstrate placement of intramedullary rod and distal fixating screw within the femur. IMPRESSION: Intraoperative fluoroscopic assistance provided during surgical fixation of right femur fracture Electronically Signed   By: Donavan Foil M.D.   On: 01/03/2019 22:26   Dg Hip Unilat With Pelvis 2-3 Views Right  Result Date: 01/02/2019 CLINICAL DATA:  Fall with right hip pain. EXAM: DG HIP (WITH OR WITHOUT PELVIS) 2-3V RIGHT COMPARISON:  None. FINDINGS: Displaced intertrochanteric right femur fracture involves both greater and lesser trochanters. Mild rotational component. The femoral head remains seated. The remainder the pelvis is intact. No additional acute fracture. Bones are under mineralized. Pubic symphysis and sacroiliac joints are congruent. Surgical sutures project over the lower abdomen. Chain sutures in the pelvis. IMPRESSION: Displaced intertrochanteric right femur fracture. Electronically Signed   By: Keith Rake M.D.   On: 01/02/2019 23:54    EKG:   Orders placed or performed during the hospital encounter of 01/02/19  . ED EKG  . ED EKG    ASSESSMENT AND PLAN:   68 year old male with past medical history significant for COPD on 3 L chronic home oxygen presents to the emergency room secondary to fall and right  hip pain.  1.  Right hip intertrochanteric fracture-after a mechanical fall -Appreciate orthopedics consult.  Postop day 1 today  -Continue pain management -Postop DVT prophylaxis started and physical therapy  2.  Leukocytosis-likely reactive. check urine analysis  3.  Chronic respiratory failure-secondary to COPD on home oxygen.  Uses mostly at night times.  Here 2 to 3 L continuous use noted. -Continue inhalers, neb treatments and monitor.  No indication for systemic steroids  4.  Acute postop blood loss anemia-hemoglobin dropped from 10 to almost 8. -We will transfuse if less than 7.  Check anemia panel.  Has elevated MCV  5.  DVT prophylaxis-started on Lovenox.  Physical therapy consulted.  Likely rehab at discharge   All the records are reviewed and case discussed with Care Management/Social Workerr. Management plans discussed with the patient, family and they are in agreement.  CODE STATUS: Full code  TOTAL TIME TAKING CARE OF THIS PATIENT: 38 minutes.   POSSIBLE D/C IN 1-2 DAYS, DEPENDING ON CLINICAL CONDITION.   Gladstone Lighter M.D on 01/04/2019 at 10:22 AM  Between 7am to 6pm - Pager - [phone]  After 6pm go to [email or web address] - password [email or web address] [facility]  Office  [phone]  CC: Primary care physician; System, Pcp Not In

## 2019-01-04 NOTE — Evaluation (Signed)
Physical Therapy Evaluation Patient Details Name: Darren Coleman MRN: [id number] DOB: 1951-05-02 Today's Date: 01/04/2019   History of Present Illness  presented to ER status post mechanical fall in home environment; admitted with R displaced intertrochanteric fracture, s/p L ORIF 6/17 ([facility])  Clinical Impression  Upon evaluation, patient alert and oriented; follows commands, but requires frequent cuing for relaxation and full participation with movement.  Significant pain rating to R LE despite pre-medication; responds well to distraction.  Currently requiring mod/max assist for bed mobility; mod assist +2 for sit/stand, basic transfers and gait (5') with RW. Very short, shuffling steps; poor tolerance for WBing R LE.  Anticipate continued progress as pain better controlled and patient more aware of expectations/progression of recovery. Would benefit from skilled PT to address above deficits and promote optimal return to PLOF; recommend transition to STR upon discharge from acute hospitalization.     Follow Up Recommendations SNF    Equipment Recommendations       Recommendations for Other Services       Precautions / Restrictions Precautions Precautions: Fall Restrictions Weight Bearing Restrictions: Yes RLE Weight Bearing: Weight bearing as tolerated      Mobility  Bed Mobility Overal bed mobility: Needs Assistance Bed Mobility: Supine to Sit     Supine to sit: Mod assist     General bed mobility comments: increased time, constant cuing for sequence and relaxation technqiues; total assist for position/guarding of R LE  Transfers Overall transfer level: Needs assistance Equipment used: Rolling walker (2 wheeled) Transfers: Sit to/from Stand Sit to Stand: Mod assist;+2 physical assistance         General transfer comment: heavy use of bilat UEs, very slow and guarded; limited tolerance for R LE WBing  Ambulation/Gait Ambulation/Gait assistance: Min assist;Mod  assist;+2 physical assistance Gait Distance (Feet): 5 Feet Assistive device: Rolling walker (2 wheeled)       General Gait Details: broad BOS with R LE anterior to BOS; short, shuffling steps; heavy WBing bilat UEs  Stairs            Wheelchair Mobility    Modified Rankin (Stroke Patients Only)       Balance Overall balance assessment: Needs assistance Sitting-balance support: No upper extremity supported;Feet supported Sitting balance-Leahy Scale: Fair     Standing balance support: Bilateral upper extremity supported Standing balance-Leahy Scale: Poor                               Pertinent Vitals/Pain Pain Assessment: 0-10 Pain Score: 9  Pain Location: L hip Pain Descriptors / Indicators: Aching;Grimacing Pain Intervention(s): Limited activity within patient's tolerance;Monitored during session;Premedicated before session;Repositioned    Home Living Family/patient expects to be discharged to:: Private residence Living Arrangements: Spouse/significant other Available Help at Discharge: Family Type of Home: House Home Access: Stairs to enter Entrance Stairs-Rails: Left Entrance Stairs-Number of Steps: 4 Home Layout: One level        Prior Function Level of Independence: Independent         Comments: Indep with ADLs, household and community mobilization; denies additional fall history     Hand Dominance        Extremity/Trunk Assessment   Upper Extremity Assessment Upper Extremity Assessment: Overall WFL for tasks assessed    Lower Extremity Assessment Lower Extremity Assessment: (R LE grossly 3-/5, limited by pain; tolerating approx 40-50% normal ROM ith isolated movement)       Communication  Communication: No difficulties  Cognition Arousal/Alertness: Awake/alert Behavior During Therapy: WFL for tasks assessed/performed Overall Cognitive Status: Within Functional Limits for tasks assessed                                  General Comments: very anxious, fearful of pain      General Comments      Exercises Other Exercises Other Exercises: Education in relaxation techniques for pain control; voices understanding, but requires continuous cuing for implementation with activity   Assessment/Plan    PT Assessment Patient needs continued PT services  PT Problem List Decreased strength;Decreased balance;Decreased range of motion;Decreased mobility;Decreased activity tolerance;Decreased safety awareness;Decreased knowledge of use of DME;Decreased knowledge of precautions;Pain       PT Treatment Interventions DME instruction;Gait training;Stair training;Functional mobility training;Therapeutic activities;Therapeutic exercise;Balance training;Patient/family education    PT Goals (Current goals can be found in the Care Plan section)  Acute Rehab PT Goals Patient Stated Goal: to try to move around PT Goal Formulation: With patient Time For Goal Achievement: 01/18/19 Potential to Achieve Goals: Good    Frequency BID   Barriers to discharge        Co-evaluation               AM-PAC PT "6 Clicks" Mobility  Outcome Measure Help needed turning from your back to your side while in a flat bed without using bedrails?: A Lot Help needed moving from lying on your back to sitting on the side of a flat bed without using bedrails?: A Lot Help needed moving to and from a bed to a chair (including a wheelchair)?: A Lot Help needed standing up from a chair using your arms (e.g., wheelchair or bedside chair)?: A Lot Help needed to walk in hospital room?: A Lot Help needed climbing 3-5 steps with a railing? : A Lot 6 Click Score: 12    End of Session Equipment Utilized During Treatment: Gait belt Activity Tolerance: Patient limited by pain Patient left: in chair;with call bell/phone within reach;with chair alarm set Nurse Communication: Mobility status PT Visit Diagnosis: Muscle weakness  (generalized) (M62.81);History of falling (Z91.81);Difficulty in walking, not elsewhere classified (R26.2);Pain Pain - Right/Left: Right Pain - part of body: Hip    Time: 1349-1429 PT Time Calculation (min) (ACUTE ONLY): 40 min   Charges:   PT Evaluation $PT Eval Moderate Complexity: 1 Mod PT Treatments $Therapeutic Activity: 8-22 mins        Shubh Chiara H. Owens Shark, PT, DPT, NCS 01/04/19, 11:19 PM [phone]

## 2019-01-04 NOTE — Progress Notes (Signed)
  Subjective: 1 Day Post-Op Procedure(s) (LRB): INTRAMEDULLARY (IM) NAIL INTERTROCHANTRIC (Right) Patient reports pain as mild.   Patient is well, and has had no acute complaints or problems PT and care management to assist with discharge planning. Negative for chest pain and shortness of breath Fever: no Gastrointestinal:Negative for nausea and vomiting  Objective: Vital signs in last 24 hours: Temp:  [97.8 F (36.6 C)-99.2 F (37.3 C)] 98.8 F (37.1 C) (06/18 0348) Pulse Rate:  [88-108] 88 (06/18 0348) Resp:  [15-20] 20 (06/18 0348) BP: (91-155)/(64-91) 126/69 (06/18 0348) SpO2:  [96 %-100 %] 100 % (06/18 0348)  Intake/Output from previous day:  Intake/Output Summary (Last 24 hours) at 01/04/2019 0733 Last data filed at 01/04/2019 0537 Gross per 24 hour  Intake 1035.91 ml  Output 1400 ml  Net -364.09 ml    Intake/Output this shift: No intake/output data recorded.  Labs: Recent Labs    01/02/19 2325 01/03/19 0451 01/04/19 0427  HGB 11.7* 10.1* 7.9*   Recent Labs    01/03/19 0451 01/04/19 0427  WBC 17.9* 15.4*  RBC 3.00* 2.33*  HCT 29.8* 23.4*  PLT 198 131*   Recent Labs    01/03/19 0451 01/04/19 0427  NA 132* 131*  K 3.6 3.7  CL 93* 95*  CO2 29 24  BUN 9 9  CREATININE 0.51* 0.50*  GLUCOSE 96 110*  CALCIUM 8.5* 7.8*   Recent Labs    01/03/19 0046  INR 0.9   EXAM General - Patient is Alert, Appropriate and Oriented Extremity - ABD soft Sensation intact distally Intact pulses distally Dorsiflexion/Plantar flexion intact Incision: dressing C/D/I No cellulitis present Dressing/Incision - clean, dry, no drainage Motor Function - intact, moving foot and toes well on exam.  Abdomen soft with normal BS this AM.  Past Medical History:  Diagnosis Date  . Cancer ([place])   . COPD (chronic obstructive pulmonary disease) (HCC)     Assessment/Plan: 1 Day Post-Op Procedure(s) (LRB): INTRAMEDULLARY (IM) NAIL INTERTROCHANTRIC (Right) Principal  Problem:   Closed right hip fracture (HCC) Active Problems:   COPD (chronic obstructive pulmonary disease) (HCC)  Estimated body mass index is 23.08 kg/m as calculated from the following:   Height as of this encounter: 5\' 6"  (1.676 m).   Weight as of this encounter: 64.9 kg. Advance diet Up with therapy D/C IV fluids when tolerating po intake.  Labs reviewed this AM, Hg 7.9, but denies any dizziness in bed.  Will monitor when up with therapy. Na 131, increase oral intake today. CBC and BMP ordered for tomorrow morning. Care management to assist with discharge planning. Begin working on BM.  DVT Prophylaxis - Lovenox, Foot Pumps and TED hose Weight-Bearing as tolerated to right leg  J. Cameron Proud, PA-C [facility] 01/04/2019, 7:33 AM

## 2019-01-04 NOTE — Anesthesia Postprocedure Evaluation (Signed)
Anesthesia Post Note  Patient: Darren Coleman  Procedure(s) Performed: INTRAMEDULLARY (IM) NAIL INTERTROCHANTRIC (Right Hip)  Patient location during evaluation: Nursing Unit Anesthesia Type: Spinal Level of consciousness: awake, awake and alert and oriented Pain management: pain level controlled Vital Signs Assessment: post-procedure vital signs reviewed and stable Respiratory status: spontaneous breathing, nonlabored ventilation and respiratory function stable Cardiovascular status: stable Postop Assessment: no headache, no backache, patient able to bend at knees, adequate PO intake, able to ambulate and no apparent nausea or vomiting Anesthetic complications: no     Last Vitals:  Vitals:   01/04/19 0758 01/04/19 0802  BP:  131/82  Pulse:  96  Resp:  18  Temp:    SpO2: 99% 100%    Last Pain:  Vitals:   01/04/19 0737  TempSrc:   PainSc: 8                  Pate Aylward,  [name]

## 2019-01-04 NOTE — Progress Notes (Signed)
PT Cancellation Note  Patient Details Name: Darren Coleman MRN: [id number] DOB: 1950/10/14   Cancelled Treatment:    Reason Eval/Treat Not Completed: Pain limiting ability to participate(Consult received and chart reviewed.  Patient declined participation with session despite encouragement-unable to promote supine therex, dangling or OOB attempts.  Patient insistent on waiting until after administration of next pain medication dosing for improved pain control.  Agreeable to consider attempting only after 12:30 this date.  Will continue efforts in PM as appropriate.)  Tijuan Dantes H. Owens Shark, PT, DPT, NCS 01/04/19, 9:44 AM [phone]

## 2019-01-05 ENCOUNTER — Inpatient Hospital Stay: Payer: Medicare Other

## 2019-01-05 LAB — BASIC METABOLIC PANEL
Anion gap: 8 (ref 5–15)
BUN: 6 mg/dL — ABNORMAL LOW (ref 8–23)
CO2: 27 mmol/L (ref 22–32)
Calcium: 8.4 mg/dL — ABNORMAL LOW (ref 8.9–10.3)
Chloride: 94 mmol/L — ABNORMAL LOW (ref 98–111)
Creatinine, Ser: 0.49 mg/dL — ABNORMAL LOW (ref 0.61–1.24)
GFR calc Af Amer: >60 mL/min (ref >60)
GFR calc non Af Amer: >60 mL/min (ref >60)
Glucose, Bld: 104 mg/dL — ABNORMAL HIGH (ref 70–99)
Potassium: 3.8 mmol/L (ref 3.5–5.1)
Sodium: 129 mmol/L — ABNORMAL LOW (ref 135–145)

## 2019-01-05 LAB — CBC
HCT: 21.1 % — ABNORMAL LOW (ref 39.0–52.0)
Hemoglobin: 7 g/dL — ABNORMAL LOW (ref 13.0–17.0)
MCH: 33.7 pg (ref 26.0–34.0)
MCHC: 33.2 g/dL (ref 30.0–36.0)
MCV: 101.4 fL — ABNORMAL HIGH (ref 80.0–100.0)
Platelets: 127 10*3/uL — ABNORMAL LOW (ref 150–400)
RBC: 2.08 MIL/uL — ABNORMAL LOW (ref 4.22–5.81)
RDW: 12.1 % (ref 11.5–15.5)
WBC: 10.9 10*3/uL — ABNORMAL HIGH (ref 4.0–10.5)
nRBC: 0 % (ref 0.0–0.2)

## 2019-01-05 LAB — ABO/RH: ABO/RH(D): A POS

## 2019-01-05 LAB — PREPARE RBC (CROSSMATCH)

## 2019-01-05 LAB — HEMOGLOBIN AND HEMATOCRIT, BLOOD
HCT: 29.2 % — ABNORMAL LOW (ref 39.0–52.0)
Hemoglobin: 10.3 g/dL — ABNORMAL LOW (ref 13.0–17.0)

## 2019-01-05 MED ORDER — HYDROMORPHONE HCL 1 MG/ML IJ SOLN
1.0000 mg | INTRAMUSCULAR | Status: DC | PRN
  Administered 2019-01-05: 1 mg via INTRAVENOUS
  Filled 2019-01-05: qty 1

## 2019-01-05 MED ORDER — METOPROLOL SUCCINATE ER 25 MG PO TB24
25.0000 mg | ORAL_TABLET | Freq: Every day | ORAL | Status: DC
  Administered 2019-01-05 – 2019-01-07 (×2): 25 mg via ORAL
  Filled 2019-01-05 (×4): qty 1

## 2019-01-05 MED ORDER — ALUM & MAG HYDROXIDE-SIMETH 200-200-20 MG/5ML PO SUSP
30.0000 mL | ORAL | Status: DC | PRN
  Administered 2019-01-05: 30 mL via ORAL
  Filled 2019-01-05: qty 30

## 2019-01-05 MED ORDER — SODIUM CHLORIDE 0.9% IV SOLUTION
Freq: Once | INTRAVENOUS | Status: AC
  Administered 2019-01-05: 11:00:00 via INTRAVENOUS

## 2019-01-05 MED ORDER — BISACODYL 5 MG PO TBEC
10.0000 mg | DELAYED_RELEASE_TABLET | Freq: Every day | ORAL | Status: AC
  Administered 2019-01-05 – 2019-01-07 (×3): 10 mg via ORAL
  Filled 2019-01-05 (×3): qty 2

## 2019-01-05 MED ORDER — FLUTICASONE FUROATE-VILANTEROL 100-25 MCG/INH IN AEPB
1.0000 | INHALATION_SPRAY | Freq: Every day | RESPIRATORY_TRACT | Status: DC
  Administered 2019-01-05 – 2019-01-10 (×4): 1 via RESPIRATORY_TRACT
  Filled 2019-01-05 (×3): qty 28

## 2019-01-05 MED ORDER — ATORVASTATIN CALCIUM 20 MG PO TABS
40.0000 mg | ORAL_TABLET | Freq: Every day | ORAL | Status: DC
  Administered 2019-01-05 – 2019-01-10 (×6): 40 mg via ORAL
  Filled 2019-01-05 (×6): qty 2

## 2019-01-05 MED ORDER — UMECLIDINIUM BROMIDE 62.5 MCG/INH IN AEPB: 1.0000 | INHALATION_SPRAY | Freq: Every day | RESPIRATORY_TRACT | Status: DC

## 2019-01-05 MED ORDER — VITAMIN B-12 1000 MCG PO TABS
500.0000 ug | ORAL_TABLET | Freq: Every day | ORAL | Status: DC
  Administered 2019-01-05 – 2019-01-10 (×6): 500 ug via ORAL
  Filled 2019-01-05 (×6): qty 1

## 2019-01-05 MED ORDER — FUROSEMIDE 40 MG PO TABS
40.0000 mg | ORAL_TABLET | Freq: Every day | ORAL | Status: DC
  Administered 2019-01-05 – 2019-01-10 (×6): 40 mg via ORAL
  Filled 2019-01-05 (×6): qty 1

## 2019-01-05 MED ORDER — FLUTICASONE-UMECLIDIN-VILANT 100-62.5-25 MCG/INH IN AEPB: 1.0000 | INHALATION_SPRAY | Freq: Every day | RESPIRATORY_TRACT | Status: DC

## 2019-01-05 MED ORDER — PREDNISONE 10 MG PO TABS
10.0000 mg | ORAL_TABLET | Freq: Every day | ORAL | Status: DC
  Administered 2019-01-05 – 2019-01-10 (×6): 10 mg via ORAL
  Filled 2019-01-05 (×6): qty 1

## 2019-01-05 MED ORDER — TIZANIDINE HCL 4 MG PO TABS
4.0000 mg | ORAL_TABLET | Freq: Four times a day (QID) | ORAL | Status: DC | PRN
  Administered 2019-01-06 – 2019-01-07 (×2): 4 mg via ORAL
  Filled 2019-01-05 (×5): qty 1

## 2019-01-05 MED ORDER — CYANOCOBALAMIN 1000 MCG/ML IJ SOLN
1000.0000 ug | Freq: Once | INTRAMUSCULAR | Status: DC
  Filled 2019-01-05: qty 1

## 2019-01-05 MED ORDER — FERROUS SULFATE 325 (65 FE) MG PO TABS
325.0000 mg | ORAL_TABLET | Freq: Two times a day (BID) | ORAL | Status: DC
  Administered 2019-01-05 – 2019-01-10 (×11): 325 mg via ORAL
  Filled 2019-01-05 (×11): qty 1

## 2019-01-05 MED ORDER — UMECLIDINIUM BROMIDE 62.5 MCG/INH IN AEPB
1.0000 | INHALATION_SPRAY | Freq: Every day | RESPIRATORY_TRACT | Status: DC
  Administered 2019-01-05 – 2019-01-08 (×3): 1 via RESPIRATORY_TRACT
  Filled 2019-01-05: qty 7

## 2019-01-05 MED ORDER — FLUTICASONE FUROATE-VILANTEROL 100-25 MCG/INH IN AEPB: 1.0000 | INHALATION_SPRAY | Freq: Every day | RESPIRATORY_TRACT | Status: DC

## 2019-01-05 NOTE — Progress Notes (Signed)
Subjective: 2 Days Post-Op Procedure(s) (LRB): INTRAMEDULLARY (IM) NAIL INTERTROCHANTRIC (Right) Patient reports pain as moderate this AM, 8 out of 10 .   Patient is well, and has had no acute complaints or problems Current plan is for discharge to SNF when medically appropriate. Negative for chest pain and shortness of breath Fever: no Gastrointestinal:Negative for nausea and vomiting  Objective: Vital signs in last 24 hours: Temp:  [98.2 F (36.8 C)-98.3 F (36.8 C)] 98.3 F (36.8 C) (06/18 2325) Pulse Rate:  [96-104] 102 (06/18 2325) Resp:  [18-19] 19 (06/18 2325) BP: (113-131)/(75-82) 113/75 (06/18 2325) SpO2:  [94 %-100 %] 98 % (06/18 2325)  Intake/Output from previous day:  Intake/Output Summary (Last 24 hours) at 01/05/2019 0718 Last data filed at 01/05/2019 0658 Gross per 24 hour  Intake 1325.28 ml  Output 1750 ml  Net -424.72 ml    Intake/Output this shift: No intake/output data recorded.  Labs: Recent Labs    01/02/19 2325 01/03/19 0451 01/04/19 0427 01/05/19 0636  HGB 11.7* 10.1* 7.9* 7.0*   Recent Labs    01/04/19 0427 01/05/19 0636  WBC 15.4* 10.9*  RBC 2.33*  2.27* 2.08*  HCT 23.4* 21.1*  PLT 131* 127*   Recent Labs    01/03/19 0451 01/04/19 0427  NA 132* 131*  K 3.6 3.7  CL 93* 95*  CO2 29 24  BUN 9 9  CREATININE 0.51* 0.50*  GLUCOSE 96 110*  CALCIUM 8.5* 7.8*   Recent Labs    01/03/19 0046  INR 0.9   EXAM General - Patient is Alert, Appropriate and Oriented Extremity - ABD soft Sensation intact distally Intact pulses distally Dorsiflexion/Plantar flexion intact Incision: scant drainage No cellulitis present Dressing/Incision - Minimal bloody drainage, mostly to the most proximal incision. Motor Function - intact, moving foot and toes well on exam.  Abdomen soft with normal BS this AM.  Past Medical History:  Diagnosis Date  . Cancer ([place])   . COPD (chronic obstructive pulmonary disease) (HCC)     Assessment/Plan: 2  Days Post-Op Procedure(s) (LRB): INTRAMEDULLARY (IM) NAIL INTERTROCHANTRIC (Right) Principal Problem:   Closed right hip fracture (HCC) Active Problems:   COPD (chronic obstructive pulmonary disease) (HCC)  Estimated body mass index is 23.08 kg/m as calculated from the following:   Height as of this encounter: 5\' 6"  (1.676 m).   Weight as of this encounter: 64.9 kg. Advance diet Up with therapy  Labs reviewed this AM, Hg 7.0 this AM,Will transfuse 2 units of PRBC today.  Orders have been placed. Begin working on BM. Continue with PT today. Current plan is for discharge to SNF. CBC and BMP ordered for tomorrow morning.  DVT Prophylaxis - Lovenox, Foot Pumps and TED hose Weight-Bearing as tolerated to right leg  J. Cameron Proud, PA-C [facility] 01/05/2019, 7:18 AM

## 2019-01-05 NOTE — Progress Notes (Signed)
PT Cancellation Note  Patient Details Name: Darren Coleman MRN: [id number] DOB: 1951-06-09   Cancelled Treatment:    Reason Eval/Treat Not Completed: Medical issues which prohibited therapy(PM treatment session attempted. Patient currently receiving blood transfusion, just started second unit.  Will re-attempt at later time/date as medically appropriate and available.)   [name] H. Owens Shark, PT, DPT, NCS 01/05/19, 2:56 PM [phone]

## 2019-01-05 NOTE — Care Management Important Message (Signed)
Important Message  Patient Details  Name: Darren Coleman MRN: [id number] Date of Birth: 1950/08/10   Medicare Important Message Given:  Yes     [name] 01/05/2019, 12:08 PM

## 2019-01-05 NOTE — Progress Notes (Signed)
Physical Therapy Treatment Patient Details Name: Darren Coleman MRN: [id number] DOB: 28-Jul-1950 Today's Date: 01/05/2019    History of Present Illness presented to ER status post mechanical fall in home environment; admitted with R displaced intertrochanteric fracture, s/p L ORIF 6/17 ([facility])    PT Comments    Patient noted with HgB 7.0 this AM, pending blood transfusion; patient refused OOB as result.  Session emphasis on supine LE therex, but remains very guarded due to pain.  Constant cuing for relaxation and isolated muscle activation throughout session as appropriate. Will plan to continue with OOB attempts this PM post blood-transfusion.   Follow Up Recommendations  SNF     Equipment Recommendations       Recommendations for Other Services       Precautions / Restrictions Precautions Precautions: Fall Restrictions Weight Bearing Restrictions: Yes RLE Weight Bearing: Weight bearing as tolerated    Mobility  Bed Mobility               General bed mobility comments: patient declined OOB due to pain and pending blood transfusion  Transfers                 General transfer comment: patient declined OOB due to pain and pending blood transfusion  Ambulation/Gait             General Gait Details: patient declined OOB due to pain and pending blood transfusion   Stairs             Wheelchair Mobility    Modified Rankin (Stroke Patients Only)       Balance                                            Cognition   Behavior During Therapy: WFL for tasks assessed/performed Overall Cognitive Status: Within Functional Limits for tasks assessed                                        Exercises Other Exercises Other Exercises: Supine R LE therex, 1x10, act assist ROM for muscular strength/flexibility.  Patient very guarded, limited by pain; extensive cuing for relaxtion and isolated muscle activation as  appropriate.    General Comments        Pertinent Vitals/Pain Pain Assessment: 0-10 Pain Score: 8  Pain Location: L hip Pain Descriptors / Indicators: Aching;Grimacing Pain Intervention(s): Limited activity within patient's tolerance;Monitored during session;Premedicated before session;Repositioned    Home Living                      Prior Function            PT Goals (current goals can now be found in the care plan section) Acute Rehab PT Goals Patient Stated Goal: to try to move around PT Goal Formulation: With patient Time For Goal Achievement: 01/18/19 Potential to Achieve Goals: Good Progress towards PT goals: Progressing toward goals    Frequency    BID      PT Plan Current plan remains appropriate    Co-evaluation              AM-PAC PT "6 Clicks" Mobility   Outcome Measure  Help needed turning from your back to your side while in a flat bed without  using bedrails?: A Lot Help needed moving from lying on your back to sitting on the side of a flat bed without using bedrails?: A Lot Help needed moving to and from a bed to a chair (including a wheelchair)?: A Lot Help needed standing up from a chair using your arms (e.g., wheelchair or bedside chair)?: A Lot Help needed to walk in hospital room?: A Lot Help needed climbing 3-5 steps with a railing? : Total 6 Click Score: 11    End of Session Equipment Utilized During Treatment: Gait belt Activity Tolerance: Patient tolerated treatment well Patient left: in bed;with call bell/phone within reach;with bed alarm set Nurse Communication: Mobility status PT Visit Diagnosis: Muscle weakness (generalized) (M62.81);History of falling (Z91.81);Difficulty in walking, not elsewhere classified (R26.2);Pain Pain - Right/Left: Right Pain - part of body: Hip     Time: 6767-2094 PT Time Calculation (min) (ACUTE ONLY): 19 min  Charges:  $Therapeutic Exercise: 8-22 mins                     Darren Coleman H.  Darren Coleman, PT, DPT, NCS 01/05/19, 10:11 AM [phone]

## 2019-01-05 NOTE — Progress Notes (Signed)
[facility] [place] NAME: Darren Coleman    MR#:  [id number]  DATE OF BIRTH:  [birth year]  SUBJECTIVE:  CHIEF COMPLAINT:   Chief Complaint  Patient presents with  . Hip Pain   -Postop day 2 after right hip surgery.    -Hemoglobin dropped after surgery to 7- so 2 units packed RBC transfusion ordered by orthopedics today -Poor progress with physical therapy due to pain  REVIEW OF SYSTEMS:  Review of Systems  Constitutional: Positive for malaise/fatigue. Negative for chills and fever.  HENT: Negative for congestion, hearing loss and nosebleeds.   Eyes: Negative for blurred vision and double vision.  Respiratory: Positive for shortness of breath. Negative for cough and wheezing.   Cardiovascular: Negative for chest pain and palpitations.  Gastrointestinal: Negative for abdominal pain, constipation, diarrhea, nausea and vomiting.  Genitourinary: Negative for dysuria.  Musculoskeletal: Positive for joint pain and myalgias.  Neurological: Negative for dizziness, focal weakness, seizures, weakness and headaches.  Psychiatric/Behavioral: Negative for depression.    DRUG ALLERGIES:   Allergies  Allergen Reactions  . Sulfa Antibiotics     VITALS:  Blood pressure 135/76, pulse 100, temperature 99.3 F (37.4 C), temperature source Oral, resp. rate 18, height 5\' 6"  (1.676 m), weight 64.9 kg, SpO2 98 %.  PHYSICAL EXAMINATION:  Physical Exam   GENERAL:  68 y.o.-year-old patient lying in the bed with no acute distress.  EYES: Pupils equal, round, reactive to light and accommodation. No scleral icterus. Extraocular muscles intact.  HEENT: Head atraumatic, normocephalic. Oropharynx and nasopharynx clear.  NECK:  Supple, no jugular venous distention. No thyroid enlargement, no tenderness.  LUNGS: Scant breath sounds bilaterally, occasional rhonchi, no wheezing, rales or crepitation. No use of accessory muscles of respiration.  CARDIOVASCULAR:  S1, S2 normal. No murmurs, rubs, or gallops.  ABDOMEN: Soft, nontender, nondistended. Bowel sounds present. No organomegaly or mass.  EXTREMITIES: Right hip dressing in place.  No pedal edema, cyanosis, or clubbing.  NEUROLOGIC: Cranial nerves II through XII are intact. Muscle strength 5/5 in all extremities except right lower extremity due to significant pain. Sensation intact. Gait not checked.  PSYCHIATRIC: The patient is alert and oriented x 3.  SKIN: No obvious rash, lesion, or ulcer.    LABORATORY PANEL:   CBC Recent Labs  Lab 01/05/19 0636  WBC 10.9*  HGB 7.0*  HCT 21.1*  PLT 127*   ------------------------------------------------------------------------------------------------------------------  Chemistries  Recent Labs  Lab 01/02/19 2325  01/05/19 0636  NA 130*   < > 129*  K 4.0   < > 3.8  CL 92*   < > 94*  CO2 25   < > 27  GLUCOSE 85   < > 104*  BUN 8   < > 6*  CREATININE 0.56*   < > 0.49*  CALCIUM 8.6*   < > 8.4*  AST 53*  --   --   ALT 56*  --   --   ALKPHOS 175*  --   --   BILITOT 0.6  --   --    < > = values in this interval not displayed.   ------------------------------------------------------------------------------------------------------------------  Cardiac Enzymes Recent Labs  Lab 01/02/19 2325  TROPONINI <0.03   ------------------------------------------------------------------------------------------------------------------  RADIOLOGY:  Dg Hip Operative Unilat W Or W/o Pelvis Right  Result Date: 01/03/2019 CLINICAL DATA:  Right hip fracture EXAM: OPERATIVE right HIP (WITH PELVIS IF PERFORMED) 8 VIEWS TECHNIQUE: Fluoroscopic spot image(s) were submitted for interpretation post-operatively. COMPARISON:  01/02/2019 FINDINGS: Eight low resolution intraoperative spot views of the right hip. Total fluoroscopy time was 1 minutes 6 seconds. Initial images demonstrate right intertrochanteric fracture. Subsequent images demonstrate placement of  intramedullary rod and distal fixating screw within the femur. IMPRESSION: Intraoperative fluoroscopic assistance provided during surgical fixation of right femur fracture Electronically Signed   By: Donavan Foil M.D.   On: 01/03/2019 22:26    EKG:   Orders placed or performed during the hospital encounter of 01/02/19  . ED EKG  . ED EKG    ASSESSMENT AND PLAN:   68 year old male with past medical history significant for COPD on 3 L chronic home oxygen presents to the emergency room secondary to fall and right hip pain.  1.  Right hip intertrochanteric fracture-after a mechanical fall -Appreciate orthopedics consult.  Postop day 2 today  -Continue pain management-patient seems to be in significant pain -Poor progress with physical therapy due to pain.  Will need rehab at discharge -Started on Lovenox for DVT prophylaxis.  2.  Leukocytosis-likely reactive. check urine analysis  3.  Chronic respiratory failure-secondary to COPD on home oxygen.  Uses mostly at night times.  Here 2 to 3 L continuous use noted. -Continue inhalers, neb treatments and monitor.  No indication for systemic steroids  4.  Acute postop blood loss anemia-hemoglobin further dropped from 10 to 7 today -Has elevated MCV -Iron deficiency noted on labs and severely low B12.  Will replace B12.  Units packed RBC transfusion ordered.  Oral iron to be started.  5.  DVT prophylaxis- on Lovenox.  Physical therapy consulted.  Likely rehab at discharge   All the records are reviewed and case discussed with Care Management/Social Workerr. Management plans discussed with the patient, family and they are in agreement.  CODE STATUS: Full code  TOTAL TIME TAKING CARE OF THIS PATIENT: 38 minutes.   POSSIBLE D/C IN 1-2 DAYS, DEPENDING ON CLINICAL CONDITION.   Gladstone Lighter M.D on 01/05/2019 at 10:39 AM  Between 7am to 6pm - Pager - [phone]  After 6pm go to [email or web address] - password [email or web address]  [facility]  Office  [phone]  CC: Primary care physician; System, Pcp Not In

## 2019-01-05 NOTE — TOC Progression Note (Signed)
Transition of Care [facility]) - Progression Note    Patient Details  Name: Darren Coleman MRN: [id number] Date of Birth: 1951/01/10  [facility] of [facility] CM/SW Contact  Maudene Stotler, Lenice Llamas Phone Number: [phone]  01/05/2019, 10:34 AM  Clinical Narrative:   PT is recommending SNF. Clinical Education officer, museum (CSW) met with patient and presented bed offers. Patient chose [facility]. Per Otila Kluver [facility] liaison she will start [facility] authorization today. CSW will continue to follow and assist as needed.         Expected Discharge Plan: [place] Barriers to Discharge: Continued Medical Work up  Expected Discharge Plan and Services Expected Discharge Plan: [place] In-house Referral: Clinical Social Work Discharge Planning Services: CM Consult   Living arrangements for the past 2 months: Single Family Home                                       Social Determinants of Health (SDOH) Interventions    Readmission Risk Interventions No flowsheet data found.

## 2019-01-06 LAB — TYPE AND SCREEN
ABO/RH(D): A POS
Antibody Screen: NEGATIVE
Unit division: 0
Unit division: 0

## 2019-01-06 LAB — BASIC METABOLIC PANEL
Anion gap: 10 (ref 5–15)
BUN: 7 mg/dL — ABNORMAL LOW (ref 8–23)
CO2: 30 mmol/L (ref 22–32)
Calcium: 8.5 mg/dL — ABNORMAL LOW (ref 8.9–10.3)
Chloride: 91 mmol/L — ABNORMAL LOW (ref 98–111)
Creatinine, Ser: 0.39 mg/dL — ABNORMAL LOW (ref 0.61–1.24)
GFR calc Af Amer: >60 mL/min (ref >60)
GFR calc non Af Amer: >60 mL/min (ref >60)
Glucose, Bld: 95 mg/dL (ref 70–99)
Potassium: 3.9 mmol/L (ref 3.5–5.1)
Sodium: 131 mmol/L — ABNORMAL LOW (ref 135–145)

## 2019-01-06 LAB — BPAM RBC
Blood Product Expiration Date: 202007022359
Blood Product Expiration Date: 202007052359
ISSUE DATE / TIME: 202006191055
ISSUE DATE / TIME: 202006191443
Unit Type and Rh: 6200
Unit Type and Rh: 6200

## 2019-01-06 LAB — CBC
HCT: 28 % — ABNORMAL LOW (ref 39.0–52.0)
Hemoglobin: 9.8 g/dL — ABNORMAL LOW (ref 13.0–17.0)
MCH: 33.4 pg (ref 26.0–34.0)
MCHC: 35 g/dL (ref 30.0–36.0)
MCV: 95.6 fL (ref 80.0–100.0)
Platelets: 147 10*3/uL — ABNORMAL LOW (ref 150–400)
RBC: 2.93 MIL/uL — ABNORMAL LOW (ref 4.22–5.81)
RDW: 13.2 % (ref 11.5–15.5)
WBC: 10.9 10*3/uL — ABNORMAL HIGH (ref 4.0–10.5)
nRBC: 0 % (ref 0.0–0.2)

## 2019-01-06 MED ORDER — ENOXAPARIN SODIUM 40 MG/0.4ML ~~LOC~~ SOLN: 40.0000 mg | SUBCUTANEOUS | 0 refills | Status: DC

## 2019-01-06 MED ORDER — ALBUTEROL SULFATE HFA 108 (90 BASE) MCG/ACT IN AERS
2.0000 | INHALATION_SPRAY | RESPIRATORY_TRACT | Status: DC | PRN
  Administered 2019-01-06 – 2019-01-08 (×3): 2 via RESPIRATORY_TRACT
  Filled 2019-01-06: qty 6.7

## 2019-01-06 MED ORDER — OXYCODONE HCL 5 MG PO TABS: 5.0000 mg | ORAL_TABLET | ORAL | 0 refills | Status: DC | PRN

## 2019-01-06 MED ORDER — TRAMADOL HCL 50 MG PO TABS: 50.0000 mg | ORAL_TABLET | Freq: Four times a day (QID) | ORAL | 1 refills | Status: DC

## 2019-01-06 NOTE — Progress Notes (Signed)
[facility] NAME: Darren Coleman    MR#:  [id number]  DATE OF BIRTH:  11-20-1950  SUBJECTIVE:  CHIEF COMPLAINT:   Chief Complaint  Patient presents with  . Hip Pain   -Postop day 3 after right hip surgery.    -Still complains of extensive hip pain. -Received 2 units packed RBC transfusion for low hemoglobin yesterday, it is now stable and greater than 9  REVIEW OF SYSTEMS:  Review of Systems  Constitutional: Positive for malaise/fatigue. Negative for chills and fever.  HENT: Negative for congestion, hearing loss and nosebleeds.   Eyes: Negative for blurred vision and double vision.  Respiratory: Positive for shortness of breath. Negative for cough and wheezing.   Cardiovascular: Negative for chest pain and palpitations.  Gastrointestinal: Negative for abdominal pain, constipation, diarrhea, nausea and vomiting.  Genitourinary: Negative for dysuria.  Musculoskeletal: Positive for joint pain and myalgias.  Neurological: Negative for dizziness, focal weakness, seizures, weakness and headaches.  Psychiatric/Behavioral: Negative for depression.    DRUG ALLERGIES:   Allergies  Allergen Reactions  . Sulfa Antibiotics     VITALS:  Blood pressure 114/72, pulse 88, temperature 98 F (36.7 C), temperature source Oral, resp. rate 18, height 5\' 6"  (1.676 m), weight 64.9 kg, SpO2 97 %.  PHYSICAL EXAMINATION:  Physical Exam   GENERAL:  68 y.o.-year-old patient sitting in the recliner, and seems to be in a lot of right hip pain.  EYES: Pupils equal, round, reactive to light and accommodation. No scleral icterus. Extraocular muscles intact.  HEENT: Head atraumatic, normocephalic. Oropharynx and nasopharynx clear.  NECK:  Supple, no jugular venous distention. No thyroid enlargement, no tenderness.  LUNGS: Scant breath sounds bilaterally, occasional rhonchi, no wheezing, rales or crepitation. No use of accessory muscles of respiration.   CARDIOVASCULAR: S1, S2 normal. No murmurs, rubs, or gallops.  ABDOMEN: Soft, nontender, nondistended. Bowel sounds present. No organomegaly or mass.  EXTREMITIES: Right hip dressing in place.  No pedal edema, cyanosis, or clubbing.  NEUROLOGIC: Cranial nerves II through XII are intact. Muscle strength 5/5 in all extremities except right lower extremity due to significant pain. Sensation intact. Gait not checked.  PSYCHIATRIC: The patient is alert and oriented x 3.  SKIN: No obvious rash, lesion, or ulcer.    LABORATORY PANEL:   CBC Recent Labs  Lab 01/06/19 0504  WBC 10.9*  HGB 9.8*  HCT 28.0*  PLT 147*   ------------------------------------------------------------------------------------------------------------------  Chemistries  Recent Labs  Lab 01/02/19 2325  01/06/19 0504  NA 130*   < > 131*  K 4.0   < > 3.9  CL 92*   < > 91*  CO2 25   < > 30  GLUCOSE 85   < > 95  BUN 8   < > 7*  CREATININE 0.56*   < > 0.39*  CALCIUM 8.6*   < > 8.5*  AST 53*  --   --   ALT 56*  --   --   ALKPHOS 175*  --   --   BILITOT 0.6  --   --    < > = values in this interval not displayed.   ------------------------------------------------------------------------------------------------------------------  Cardiac Enzymes Recent Labs  Lab 01/02/19 2325  TROPONINI <0.03   ------------------------------------------------------------------------------------------------------------------  RADIOLOGY:  Dg Abd 1 View  Result Date: 01/05/2019 CLINICAL DATA:  Ileus distension EXAM: ABDOMEN - 1 VIEW COMPARISON:  05/07/2009 FINDINGS: Mild to moderate diffuse gaseous distention of large and small  bowel with gas down to the rectum. Mild stool in the colon. Postsurgical changes of the right hip partially visualized. IMPRESSION: Mild to moderate diffuse gaseous distention of the bowel, favor ileus. Electronically Signed   By: Donavan Foil M.D.   On: 01/05/2019 16:19    EKG:   Orders placed or  performed during the hospital encounter of 01/02/19  . ED EKG  . ED EKG    ASSESSMENT AND PLAN:   68 year old male with past medical history significant for COPD on 3 L chronic home oxygen presents to the emergency room secondary to fall and right hip pain.  1.  Right hip intertrochanteric fracture-after a mechanical fall -Appreciate orthopedics consult.  Postop day 3 today  -Continue pain management-patient seems to be in significant pain -Poor progress with physical therapy due to pain.  Will need rehab at discharge - on Lovenox for DVT prophylaxis.  2.   Abdominal distention-KUB showing ileus.  Patient passing flatus.  Will need to have a bowel movement.  Laxatives ordered.  Continue to monitor at this time  3.  Chronic respiratory failure-secondary to COPD on home oxygen.  Uses mostly at night times.  Here 2 to 3 L continuous use noted. -Continue inhalers, neb treatments and monitor.  No indication for systemic steroids  4.  Acute postop blood loss anemia-hemoglobin further dropped from 10 to 7 after surgery. -Received 2 units packed RBC transfusion and improved hemoglobin today. -Has elevated MCV and severely low vitamin B12 levels.  Replaced with intramuscular shot and started on oral supplements. -Also iron deficiency noted and oral iron started.  5.  DVT prophylaxis- on Lovenox.  Physical therapy consulted.  Likely rehab at discharge -Discharge on Monday   All the records are reviewed and case discussed with Care Management/Social Workerr. Management plans discussed with the patient, family and they are in agreement.  CODE STATUS: Full code  TOTAL TIME TAKING CARE OF THIS PATIENT: 38 minutes.   POSSIBLE D/C IN 1-2 DAYS, DEPENDING ON CLINICAL CONDITION.   Gladstone Lighter M.D on 01/06/2019 at 11:34 AM  Between 7am to 6pm - Pager - [phone]  After 6pm go to [email or web address] - password [email or web address] [facility]  Office  [phone]  CC: Primary  care physician; System, Pcp Not In

## 2019-01-06 NOTE — Progress Notes (Signed)
Physical Therapy Treatment Patient Details Name: Darren Coleman MRN: [id number] DOB: 1951-06-08 Today's Date: 01/06/2019    History of Present Illness presented to ER status post mechanical fall in home environment; admitted with R displaced intertrochanteric fracture, s/p L ORIF 6/17 ([facility]AT)    PT Comments    Patient agreeable to PT, reported pain in R hip 8/10 at rest. RN notified. Pt on 3L via [place],spO2 and HR monitored intermittently during session, pt with some desaturation with mobility, turned to 4.5L, recovered and returned to 3L, spO2>93%. Pt also with complaints of SOB after mobility as well. The patient performed supine exercises with AAROM, though pt able to initiate most movements and reported his leg was doing better than his previous session. Supine to sit modA for RLE assist as well as anterior weight shift towards EOB. Pt with bouts of moving well but often yells due to pain. Sit <> stand minA with RW, was able to take very small steps towards chair at bedside, minA for balance throughout as well as RW assistance/movement. Pt up in chair in NAD with MD at bedside at end of session. The patient would benefit from further skilled PT to continue to progress towards goals, recommendation is STR due to current level of assistance needed.    Follow Up Recommendations  SNF     Equipment Recommendations  Other (comment)(TBD)    Recommendations for Other Services       Precautions / Restrictions Precautions Precautions: Fall Restrictions Weight Bearing Restrictions: Yes RLE Weight Bearing: Weight bearing as tolerated    Mobility  Bed Mobility Overal bed mobility: Needs Assistance Bed Mobility: Supine to Sit     Supine to sit: Mod assist     General bed mobility comments: modA for RLE assist as well as anterior weight shift towards EOB. Pt with bouts of moving well but often yells due to pain with movement.  Transfers Overall transfer level: Needs assistance Equipment  used: Rolling walker (2 wheeled) Transfers: Sit to/from Stand Sit to Stand: Min assist         General transfer comment: Initial difficulty with upright standing, able to move feet to better support his body with time  Ambulation/Gait Ambulation/Gait assistance: Min assist Gait Distance (Feet): 4 Feet Assistive device: Rolling walker (2 wheeled)       General Gait Details: Able to take very small steps towards chair at bedside, minA for balance throughout as well as RW assistance.   Stairs             Wheelchair Mobility    Modified Rankin (Stroke Patients Only)       Balance Overall balance assessment: Needs assistance Sitting-balance support: No upper extremity supported;Feet supported Sitting balance-Leahy Scale: Fair     Standing balance support: Bilateral upper extremity supported Standing balance-Leahy Scale: Poor                              Cognition Arousal/Alertness: Awake/alert Behavior During Therapy: WFL for tasks assessed/performed Overall Cognitive Status: Within Functional Limits for tasks assessed                                        Exercises General Exercises - Lower Extremity Ankle Circles/Pumps: AROM;Both;10 reps Quad Sets: AROM;Both;10 reps Short Arc Quad: AAROM;10 reps;Right Heel Slides: AAROM;Right;10 reps Hip ABduction/ADduction: AAROM;Right;10 reps  General Comments        Pertinent Vitals/Pain Pain Assessment: 0-10 Pain Score: 8  Pain Location: L hip Pain Descriptors / Indicators: Guarding;Grimacing;Moaning Pain Intervention(s): Limited activity within patient's tolerance;Monitored during session;Repositioned;Patient requesting pain meds-RN notified    Home Living                      Prior Function            PT Goals (current goals can now be found in the care plan section) Progress towards PT goals: Progressing toward goals    Frequency    BID      PT Plan  Current plan remains appropriate    Co-evaluation              AM-PAC PT "6 Clicks" Mobility   Outcome Measure  Help needed turning from your back to your side while in a flat bed without using bedrails?: A Lot Help needed moving from lying on your back to sitting on the side of a flat bed without using bedrails?: A Lot Help needed moving to and from a bed to a chair (including a wheelchair)?: A Lot Help needed standing up from a chair using your arms (e.g., wheelchair or bedside chair)?: A Lot Help needed to walk in hospital room?: A Lot Help needed climbing 3-5 steps with a railing? : Total 6 Click Score: 11    End of Session Equipment Utilized During Treatment: Gait belt;Other (comment);Oxygen(3-4L) Activity Tolerance: Patient limited by pain Patient left: in chair;with call bell/phone within reach;with SCD's reapplied;with chair alarm set Nurse Communication: Mobility status PT Visit Diagnosis: Muscle weakness (generalized) (M62.81);History of falling (Z91.81);Difficulty in walking, not elsewhere classified (R26.2);Pain Pain - Right/Left: Right Pain - part of body: Hip     Time: 9470-9628 PT Time Calculation (min) (ACUTE ONLY): 34 min  Charges:  $Therapeutic Exercise: 8-22 mins $Therapeutic Activity: 8-22 mins                     Lieutenant Diego PT, DPT 11:03 AM,01/06/19 [phone]

## 2019-01-06 NOTE — Discharge Instructions (Signed)
INSTRUCTIONS AFTER Surgery  o Remove items at home which could result in a fall. This includes throw rugs or furniture in walking pathways o ICE to the affected joint every three hours while awake for 30 minutes at a time, for at least the first 3-5 days, and then as needed for pain and swelling.  Continue to use ice for pain and swelling. You may notice swelling that will progress down to the foot and ankle.  This is normal after surgery.  Elevate your leg when you are not up walking on it.   o Continue to use the breathing machine you got in the hospital (incentive spirometer) which will help keep your temperature down.  It is common for your temperature to cycle up and down following surgery, especially at night when you are not up moving around and exerting yourself.  The breathing machine keeps your lungs expanded and your temperature down.   DIET:  As you were doing prior to hospitalization, we recommend a well-balanced diet.  DRESSING / WOUND CARE / SHOWERING  Dressing changes needed.  Staples will be removed at the [facility] in 2 weeks.  No showering.  ACTIVITY  o Increase activity slowly as tolerated, but follow the weight bearing instructions below.   o No driving for 6 weeks or until further direction given by your physician.  You cannot drive while taking narcotics.  o No lifting or carrying greater than 10 lbs. until further directed by your surgeon. o Avoid periods of inactivity such as sitting longer than an hour when not asleep. This helps prevent blood clots.  o You may return to work once you are authorized by your doctor.     WEIGHT BEARING  Weightbearing as tolerated.   EXERCISES Gait training and ambulation.  CONSTIPATION  Constipation is defined medically as fewer than three stools per week and severe constipation as less than one stool per week.  Even if you have a regular bowel pattern at home, your normal regimen is likely to be disrupted due to multiple  reasons following surgery.  Combination of anesthesia, postoperative narcotics, change in appetite and fluid intake all can affect your bowels.   YOU MUST use at least one of the following options; they are listed in order of increasing strength to get the job done.  They are all available over the counter, and you may need to use some, POSSIBLY even all of these options:    Drink plenty of fluids (prune juice may be helpful) and high fiber foods Colace 100 mg by mouth twice a day  Senokot for constipation as directed and as needed Dulcolax (bisacodyl), take with full glass of water  Miralax (polyethylene glycol) once or twice a day as needed.  If you have tried all these things and are unable to have a bowel movement in the first 3-4 days after surgery call either your surgeon or your primary doctor.    If you experience loose stools or diarrhea, hold the medications until you stool forms back up.  If your symptoms do not get better within 1 week or if they get worse, check with your doctor.  If you experience "the worst abdominal pain ever" or develop nausea or vomiting, please contact the office immediately for further recommendations for treatment.   ITCHING:  If you experience itching with your medications, try taking only a single pain pill, or even half a pain pill at a time.  You can also use Benadryl over the  counter for itching or also to help with sleep.   TED HOSE STOCKINGS:  Use stockings on both legs until for at least 2 weeks or as directed by physician office. They may be removed at night for sleeping.  MEDICATIONS:  See your medication summary on the After Visit Summary that nursing will review with you.  You may have some home medications which will be placed on hold until you complete the course of blood thinner medication.  It is important for you to complete the blood thinner medication as prescribed.  PRECAUTIONS:  If you experience chest pain or shortness of breath - call  [phone] immediately for transfer to the hospital emergency department.   If you develop a fever greater that 101 F, purulent drainage from wound, increased redness or drainage from wound, foul odor from the wound/dressing, or calf pain - CONTACT YOUR SURGEON.                                                   FOLLOW-UP APPOINTMENTS:  If you do not already have a post-op appointment, please call the office for an appointment to be seen by your surgeon.  Guidelines for how soon to be seen are listed in your After Visit Summary, but are typically between 1-4 weeks after surgery.  OTHER INSTRUCTIONS:     MAKE SURE YOU:   Understand these instructions.   Get help right away if you are not doing well or get worse.    Thank you for letting us be a part of your medical care team.  It is a privilege we respect greatly.  We hope these instructions will help you stay on track for a fast and full recovery!

## 2019-01-06 NOTE — Progress Notes (Signed)
Patient wants to wait until he gets up for Physical Therapy to have his linen changed and his bath done.

## 2019-01-06 NOTE — Progress Notes (Signed)
Physical Therapy Treatment Patient Details Name: Darren Coleman MRN: [id number] DOB: 1951/06/10 Today's Date: 01/06/2019    History of Present Illness presented to ER status post mechanical fall in home environment; admitted with R displaced intertrochanteric fracture, s/p L ORIF 6/17 ([facility]AT)    PT Comments    Pt had returned to bed with nursing.  Participated in exercises as described below. Declined further OOB activity at this time.   Follow Up Recommendations  SNF     Equipment Recommendations  Rolling walker with 5" wheels    Recommendations for Other Services       Precautions / Restrictions Precautions Precautions: Fall Restrictions Weight Bearing Restrictions: Yes RLE Weight Bearing: Weight bearing as tolerated    Mobility  Bed Mobility Overal bed mobility: Needs Assistance Bed Mobility: Supine to Sit     Supine to sit: Mod assist     General bed mobility comments: modA for RLE assist as well as anterior weight shift towards EOB. Pt with bouts of moving well but often yells due to pain with movement.  Transfers Overall transfer level: Needs assistance Equipment used: Rolling walker (2 wheeled) Transfers: Sit to/from Stand Sit to Stand: Min assist         General transfer comment: Initial difficulty with upright standing, able to move feet to better support his body with time  Ambulation/Gait Ambulation/Gait assistance: Min assist Gait Distance (Feet): 4 Feet Assistive device: Rolling walker (2 wheeled)       General Gait Details: Able to take very small steps towards chair at bedside, minA for balance throughout as well as RW assistance.   Stairs             Wheelchair Mobility    Modified Rankin (Stroke Patients Only)       Balance Overall balance assessment: Needs assistance Sitting-balance support: No upper extremity supported;Feet supported Sitting balance-Leahy Scale: Fair     Standing balance support: Bilateral upper  extremity supported Standing balance-Leahy Scale: Poor                              Cognition Arousal/Alertness: Awake/alert Behavior During Therapy: WFL for tasks assessed/performed Overall Cognitive Status: Within Functional Limits for tasks assessed                                        Exercises General Exercises - Lower Extremity Ankle Circles/Pumps: AROM;Both;10 reps Quad Sets: AROM;Both;10 reps Short Arc Quad: AAROM;10 reps;Right Heel Slides: AAROM;Right;10 reps Hip ABduction/ADduction: AAROM;Right;10 reps Other Exercises Other Exercises: Supine R LE therex, 1x10, act assist ROM for muscular strength/flexibility.  Patient very guarded, limited by pain; extensive cuing for relaxtion and isolated muscle activation as appropriate.    General Comments        Pertinent Vitals/Pain Pain Assessment: Faces Pain Score: 8  Faces Pain Scale: Hurts even more Pain Location: L hip with mobility Pain Descriptors / Indicators: Guarding;Grimacing;Moaning Pain Intervention(s): Limited activity within patient's tolerance;Monitored during session;Repositioned;Patient requesting pain meds-RN notified    Home Living                      Prior Function            PT Goals (current goals can now be found in the care plan section) Progress towards PT goals: Progressing toward goals  Frequency    BID      PT Plan Current plan remains appropriate    Co-evaluation              AM-PAC PT "6 Clicks" Mobility   Outcome Measure  Help needed turning from your back to your side while in a flat bed without using bedrails?: A Lot Help needed moving from lying on your back to sitting on the side of a flat bed without using bedrails?: A Lot Help needed moving to and from a bed to a chair (including a wheelchair)?: A Lot Help needed standing up from a chair using your arms (e.g., wheelchair or bedside chair)?: A Lot Help needed to walk in  hospital room?: A Lot Help needed climbing 3-5 steps with a railing? : Total 6 Click Score: 11    End of Session Equipment Utilized During Treatment: Other (comment);Oxygen Activity Tolerance: Patient limited by pain Patient left: in bed;with call bell/phone within reach;with bed alarm set Nurse Communication: Mobility status PT Visit Diagnosis: Muscle weakness (generalized) (M62.81);History of falling (Z91.81);Difficulty in walking, not elsewhere classified (R26.2);Pain Pain - Right/Left: Right Pain - part of body: Hip     Time: 2993-7169 PT Time Calculation (min) (ACUTE ONLY): 9 min  Charges:  $Therapeutic Exercise: 8-22 mins $Therapeutic Activity: 8-22 mins                    Chesley Noon, PTA 01/06/19, 2:12 PM

## 2019-01-06 NOTE — Progress Notes (Signed)
Subjective: 3 Days Post-Op Procedure(s) (LRB): INTRAMEDULLARY (IM) NAIL INTERTROCHANTRIC (Right) Patient reports pain as moderate this AM, 5 out of 10 .   Patient is well, and has had no acute complaints or problems Current plan is for discharge to SNF when medically appropriate. Negative for chest pain and shortness of breath Fever: no Gastrointestinal:Negative for nausea and vomiting  Objective: Vital signs in last 24 hours: Temp:  [98.1 F (36.7 C)-99.3 F (37.4 C)] 99.1 F (37.3 C) (06/19 1942) Pulse Rate:  [90-113] 90 (06/20 0527) Resp:  [16-18] 18 (06/20 0527) BP: (133-153)/(75-95) 133/75 (06/20 0527) SpO2:  [91 %-100 %] 91 % (06/20 0527) FiO2 (%):  [96 %] 96 % (06/20 0210)  Intake/Output from previous day:  Intake/Output Summary (Last 24 hours) at 01/06/2019 0733 Last data filed at 01/06/2019 0527 Gross per 24 hour  Intake 705.92 ml  Output 1450 ml  Net -744.08 ml    Intake/Output this shift: No intake/output data recorded.  Labs: Recent Labs    01/04/19 0427 01/05/19 0636 01/05/19 1822 01/06/19 0504  HGB 7.9* 7.0* 10.3* 9.8*   Recent Labs    01/05/19 0636 01/05/19 1822 01/06/19 0504  WBC 10.9*  --  10.9*  RBC 2.08*  --  2.93*  HCT 21.1* 29.2* 28.0*  PLT 127*  --  147*   Recent Labs    01/05/19 0636 01/06/19 0504  NA 129* 131*  K 3.8 3.9  CL 94* 91*  CO2 27 30  BUN 6* 7*  CREATININE 0.49* 0.39*  GLUCOSE 104* 95  CALCIUM 8.4* 8.5*   No results for input(s): LABPT, INR in the last 72 hours. EXAM General - Patient is Alert, Appropriate and Oriented Extremity - ABD soft Sensation intact distally Intact pulses distally Dorsiflexion/Plantar flexion intact Incision: scant drainage No cellulitis present Dressing/Incision - Minimal bloody drainage, mostly to the most proximal incision. Motor Function - intact, moving foot and toes well on exam.  Abdomen soft with normal BS this AM.  Past Medical History:  Diagnosis Date  . Cancer ([place])   .  COPD (chronic obstructive pulmonary disease) ([facility])     Assessment/Plan: 3 Days Post-Op Procedure(s) (LRB): INTRAMEDULLARY (IM) NAIL INTERTROCHANTRIC (Right) Principal Problem:   Closed right hip fracture ([facility]) Active Problems:   COPD (chronic obstructive pulmonary disease) ([facility])  Estimated body mass index is 23.08 kg/m as calculated from the following:   Height as of this encounter: 5\' 6"  (1.676 m).   Weight as of this encounter: 64.9 kg. Advance diet Up with therapy  Labs reviewed this AM, Hemoglobin 10.3 this morning after transfusion yesterday. Continue working on BM. Continue with PT today. Current plan is for discharge to SNF.  DVT Prophylaxis - Lovenox, Foot Pumps and TED hose Weight-Bearing as tolerated to right leg  Reche Dixon PA-C [facility] 01/06/2019, 7:33 AM

## 2019-01-07 LAB — CBC
HCT: 28.7 % — ABNORMAL LOW (ref 39.0–52.0)
Hemoglobin: 9.7 g/dL — ABNORMAL LOW (ref 13.0–17.0)
MCH: 32.9 pg (ref 26.0–34.0)
MCHC: 33.8 g/dL (ref 30.0–36.0)
MCV: 97.3 fL (ref 80.0–100.0)
Platelets: 204 10*3/uL (ref 150–400)
RBC: 2.95 MIL/uL — ABNORMAL LOW (ref 4.22–5.81)
RDW: 12.8 % (ref 11.5–15.5)
WBC: 9.5 10*3/uL (ref 4.0–10.5)
nRBC: 0 % (ref 0.0–0.2)

## 2019-01-07 NOTE — Progress Notes (Signed)
Subjective: 4 Days Post-Op Procedure(s) (LRB): INTRAMEDULLARY (IM) NAIL INTERTROCHANTRIC (Right) Patient reports pain as mild to moderate Patient is well, and has had no acute complaints or problems Current plan is for discharge to SNF when medically appropriate. Negative for chest pain and shortness of breath Fever: no Gastrointestinal:Negative for nausea and vomiting  Objective: Vital signs in last 24 hours: Temp:  [98 F (36.7 C)-98.2 F (36.8 C)] 98.1 F (36.7 C) (06/20 2340) Pulse Rate:  [88-97] 95 (06/20 2340) Resp:  [18] 18 (06/20 2340) BP: (114-136)/(72-82) 126/82 (06/20 2340) SpO2:  [97 %-100 %] 98 % (06/20 2340)  Intake/Output from previous day:  Intake/Output Summary (Last 24 hours) at 01/07/2019 0721 Last data filed at 01/07/2019 0428 Gross per 24 hour  Intake 360 ml  Output 1600 ml  Net -1240 ml    Intake/Output this shift: No intake/output data recorded.  Labs: Recent Labs    01/05/19 0636 01/05/19 1822 01/06/19 0504  HGB 7.0* 10.3* 9.8*   Recent Labs    01/05/19 0636 01/05/19 1822 01/06/19 0504  WBC 10.9*  --  10.9*  RBC 2.08*  --  2.93*  HCT 21.1* 29.2* 28.0*  PLT 127*  --  147*   Recent Labs    01/05/19 0636 01/06/19 0504  NA 129* 131*  K 3.8 3.9  CL 94* 91*  CO2 27 30  BUN 6* 7*  CREATININE 0.49* 0.39*  GLUCOSE 104* 95  CALCIUM 8.4* 8.5*   No results for input(s): LABPT, INR in the last 72 hours. EXAM General - Patient is Alert, Appropriate and Oriented Extremity - ABD soft Sensation intact distally Intact pulses distally Dorsiflexion/Plantar flexion intact Incision: scant drainage No cellulitis present Dressing/Incision - Minimal bloody drainage, mostly to the most proximal incision. Motor Function - intact, moving foot and toes well on exam.   Past Medical History:  Diagnosis Date  . Cancer ([place])   . COPD (chronic obstructive pulmonary disease) (HCC)     Assessment/Plan: 4 Days Post-Op Procedure(s)  (LRB): INTRAMEDULLARY (IM) NAIL INTERTROCHANTRIC (Right) Principal Problem:   Closed right hip fracture (HCC) Active Problems:   COPD (chronic obstructive pulmonary disease) (HCC)  Estimated body mass index is 23.08 kg/m as calculated from the following:   Height as of this encounter: 5\' 6"  (1.676 m).   Weight as of this encounter: 64.9 kg. Advance diet Up with therapy  Working on bowel movement.  Continue working on BM. Continue with PT today. Current plan is for discharge to SNF.  DVT Prophylaxis - Lovenox, Foot Pumps and TED hose Weight-Bearing as tolerated to right leg  Reche Dixon PA-C [facility] Orthopaedic Surgery 01/07/2019, 7:21 AM

## 2019-01-07 NOTE — Progress Notes (Signed)
Offered patient suppository and miralax as pnt has not had BM since 6/17, BS+ in all quadrants. Abdomen distended however pnt declined twice overnight. Educated on importance but pnt still declined.Marland Kitchen

## 2019-01-07 NOTE — Progress Notes (Addendum)
Physical Therapy Treatment Patient Details Name: Darren Coleman MRN: [id number] DOB: 09/21/1950 Today's Date: 01/07/2019    History of Present Illness presented to ER status post mechanical fall in home environment; admitted with R displaced intertrochanteric fracture, s/p L ORIF 6/17 ([facility])    PT Comments    Participated in exercises as described below.  To edge of bed with mod a x 1.  Increased time given for transition as pt often stops and grabs leg while yelling out in pain.  Once sitting, is steady but leans to left to decrease weight on RLE.  Standing with increased time and min a x 1.  Once up with walker he requires min a x 1 for balance and safety.  Transfers to chair with min a x 1 and moans out in pain.  Remains in recliner after session. PT on 2 lpm O2.  Sats 94% after session HR 135.  Will continue to monitor during sessions.   F ollow Up Recommendations  SNF     Equipment Recommendations  Rolling walker with 5" wheels    Recommendations for Other Services       Precautions / Restrictions Precautions Precautions: Fall Restrictions Weight Bearing Restrictions: Yes RLE Weight Bearing: Weight bearing as tolerated    Mobility  Bed Mobility Overal bed mobility: Needs Assistance Bed Mobility: Supine to Sit     Supine to sit: Mod assist     General bed mobility comments: yelling out and stopping transitions at times due to pain.  mod assist with increased time and rails  Transfers Overall transfer level: Needs assistance Equipment used: Rolling walker (2 wheeled) Transfers: Sit to/from Stand Sit to Stand: Min assist         General transfer comment: increased time to stand and to position feet once up.  Ambulation/Gait Ambulation/Gait assistance: Min assist Gait Distance (Feet): 3 Feet Assistive device: Rolling walker (2 wheeled) Gait Pattern/deviations: Step-to pattern;Trunk flexed;Decreased step length - right;Decreased step length - left;Decreased  stance time - right Gait velocity: decreased   General Gait Details: Able to take very small steps towards chair at bedside, minA for balance throughout as well as RW assistance.   Stairs             Wheelchair Mobility    Modified Rankin (Stroke Patients Only)       Balance Overall balance assessment: Needs assistance Sitting-balance support: No upper extremity supported;Feet supported Sitting balance-Leahy Scale: Fair     Standing balance support: Bilateral upper extremity supported Standing balance-Leahy Scale: Poor Standing balance comment: Requires assist at all times to prevent fall                            Cognition Arousal/Alertness: Awake/alert Behavior During Therapy: WFL for tasks assessed/performed Overall Cognitive Status: Within Functional Limits for tasks assessed                                 General Comments: fearful of pain      Exercises Other Exercises Other Exercises: Supine R LE therex, 1x10, act assist ROM for muscular strength/flexibility.  Patient very guarded, limited by pain; extensive cuing for relaxtion and isolated muscle activation as appropriate.    General Comments        Pertinent Vitals/Pain Pain Assessment: Faces Faces Pain Scale: Hurts whole lot Pain Location: L hip with mobility Pain Descriptors / Indicators:  Guarding;Grimacing;Operative site guarding;Sore;Crying Pain Intervention(s): Limited activity within patient's tolerance;Monitored during session;Premedicated before session;Repositioned    Home Living                      Prior Function            PT Goals (current goals can now be found in the care plan section) Progress towards PT goals: Progressing toward goals    Frequency    BID      PT Plan Current plan remains appropriate    Co-evaluation              AM-PAC PT "6 Clicks" Mobility   Outcome Measure  Help needed turning from your back to your  side while in a flat bed without using bedrails?: A Lot Help needed moving from lying on your back to sitting on the side of a flat bed without using bedrails?: A Lot Help needed moving to and from a bed to a chair (including a wheelchair)?: A Lot Help needed standing up from a chair using your arms (e.g., wheelchair or bedside chair)?: A Lot Help needed to walk in hospital room?: A Lot Help needed climbing 3-5 steps with a railing? : Total 6 Click Score: 11    End of Session Equipment Utilized During Treatment: Oxygen;Gait belt Activity Tolerance: Patient limited by pain Patient left: in chair;with call bell/phone within reach;with chair alarm set   Pain - Right/Left: Right Pain - part of body: Hip     Time: 4098-1191 PT Time Calculation (min) (ACUTE ONLY): 18 min  Charges:  $Therapeutic Exercise: 8-22 mins                     Chesley Noon, PTA 01/07/19, 1:21 PM

## 2019-01-07 NOTE — Progress Notes (Signed)
[facility] [place] NAME: Darren Coleman    MR#:  [id number]  DATE OF BIRTH:  [birth year]  SUBJECTIVE:  CHIEF COMPLAINT:   Chief Complaint  Patient presents with  . Hip Pain   -Postop day 4 after right hip surgery.    -Hip pain is improving.  Hemoglobin is stable.  Possible discharge to rehab tomorrow -Constipation present  REVIEW OF SYSTEMS:  Review of Systems  Constitutional: Positive for malaise/fatigue. Negative for chills and fever.  HENT: Negative for congestion, hearing loss and nosebleeds.   Eyes: Negative for blurred vision and double vision.  Respiratory: Negative for cough, shortness of breath and wheezing.   Cardiovascular: Negative for chest pain and palpitations.  Gastrointestinal: Positive for constipation. Negative for abdominal pain, diarrhea, nausea and vomiting.  Genitourinary: Negative for dysuria.  Musculoskeletal: Positive for joint pain and myalgias.  Neurological: Negative for dizziness, focal weakness, seizures, weakness and headaches.  Psychiatric/Behavioral: Negative for depression.    DRUG ALLERGIES:   Allergies  Allergen Reactions  . Sulfa Antibiotics     VITALS:  Blood pressure (!) 165/95, pulse (!) 110, temperature 98.2 F (36.8 C), resp. rate 18, height 5\' 6"  (1.676 m), weight 64.9 kg, SpO2 98 %.  PHYSICAL EXAMINATION:  Physical Exam   GENERAL:  68 y.o.-year-old patient sitting in the recliner, and seems to be in a lot of right hip pain.  EYES: Pupils equal, round, reactive to light and accommodation. No scleral icterus. Extraocular muscles intact.  HEENT: Head atraumatic, normocephalic. Oropharynx and nasopharynx clear.  NECK:  Supple, no jugular venous distention. No thyroid enlargement, no tenderness.  LUNGS: Improved breath sounds bilaterally, occasional rhonchi, no wheezing, rales or crepitation. No use of accessory muscles of respiration.  CARDIOVASCULAR: S1, S2 normal. No murmurs, rubs,  or gallops.  ABDOMEN: Soft, nontender, less distended. Bowel sounds present. No organomegaly or mass.  EXTREMITIES: Right hip dressing in place.  No pedal edema, cyanosis, or clubbing.  NEUROLOGIC: Cranial nerves II through XII are intact. Muscle strength 5/5 in all extremities except right lower extremity due to significant pain. Sensation intact. Gait not checked.  PSYCHIATRIC: The patient is alert and oriented x 3.  SKIN: No obvious rash, lesion, or ulcer.    LABORATORY PANEL:   CBC Recent Labs  Lab 01/07/19 0641  WBC 9.5  HGB 9.7*  HCT 28.7*  PLT 204   ------------------------------------------------------------------------------------------------------------------  Chemistries  Recent Labs  Lab 01/02/19 2325  01/06/19 0504  NA 130*   < > 131*  K 4.0   < > 3.9  CL 92*   < > 91*  CO2 25   < > 30  GLUCOSE 85   < > 95  BUN 8   < > 7*  CREATININE 0.56*   < > 0.39*  CALCIUM 8.6*   < > 8.5*  AST 53*  --   --   ALT 56*  --   --   ALKPHOS 175*  --   --   BILITOT 0.6  --   --    < > = values in this interval not displayed.   ------------------------------------------------------------------------------------------------------------------  Cardiac Enzymes Recent Labs  Lab 01/02/19 2325  TROPONINI <0.03   ------------------------------------------------------------------------------------------------------------------  RADIOLOGY:  Dg Abd 1 View  Result Date: 01/05/2019 CLINICAL DATA:  Ileus distension EXAM: ABDOMEN - 1 VIEW COMPARISON:  05/07/2009 FINDINGS: Mild to moderate diffuse gaseous distention of large and small bowel with gas down to the rectum. Mild  stool in the colon. Postsurgical changes of the right hip partially visualized. IMPRESSION: Mild to moderate diffuse gaseous distention of the bowel, favor ileus. Electronically Signed   By: Donavan Foil M.D.   On: 01/05/2019 16:19    EKG:   Orders placed or performed during the hospital encounter of 01/02/19   . ED EKG  . ED EKG    ASSESSMENT AND PLAN:   68 year old male with past medical history significant for COPD on 3 L chronic home oxygen presents to the emergency room secondary to fall and right hip pain.  1.  Right hip intertrochanteric fracture-after a mechanical fall -Appreciate orthopedics consult.  Postop day 4 today  -Continue pain management- slow progression with pain control -Poor progress with physical therapy due to pain.  Will need rehab at discharge - on Lovenox for DVT prophylaxis.  2.   Abdominal distention-KUB showing ileus- but patient passing flatus- abd is less distended. -  Laxatives ordered.  Continue to monitor at this time  3.  Chronic respiratory failure-secondary to COPD on home oxygen.  Uses mostly at night times.  Here 2 to 3 L continuous use noted. -Continue inhalers, neb treatments and monitor.  No indication for systemic steroids  4.  Acute postop blood loss anemia-hemoglobin dropped after surgery and he received 2 units packed RBC transfusion and improved hemoglobin now. -Has elevated MCV and severely low vitamin B12 levels.  Replaced with intramuscular shot and started on oral supplements. -Also iron deficiency noted and oral iron started.  5.  DVT prophylaxis- on Lovenox.  Physical therapy consulted.  Likely rehab at discharge -Discharge on Monday   All the records are reviewed and case discussed with Care Management/Social Workerr. Management plans discussed with the patient, family and they are in agreement.  CODE STATUS: Full code  TOTAL TIME TAKING CARE OF THIS PATIENT: 36 minutes.   POSSIBLE D/C tomorrow, DEPENDING ON CLINICAL CONDITION.   Gladstone Lighter M.D on 01/07/2019 at 9:17 AM  Between 7am to 6pm - Pager - [phone]  After 6pm go to [email or web address] - password [email or web address] [place]  Office  [phone]  CC: Primary care physician; System, Pcp Not In

## 2019-01-08 ENCOUNTER — Inpatient Hospital Stay (HOSPITAL_COMMUNITY)
Admit: 2019-01-08 | Discharge: 2019-01-08 | Disposition: A | Discharge status: Home / Self Care | Payer: Medicare Other | Attending: Nurse Practitioner | Admitting: Nurse Practitioner

## 2019-01-08 ENCOUNTER — Inpatient Hospital Stay: Payer: Medicare Other

## 2019-01-08 DIAGNOSIS — I361 Nonrheumatic tricuspid (valve) insufficiency: Secondary | ICD-10-CM

## 2019-01-08 LAB — BASIC METABOLIC PANEL
Anion gap: 10 (ref 5–15)
Anion gap: 7 (ref 5–15)
BUN: 10 mg/dL (ref 8–23)
BUN: 11 mg/dL (ref 8–23)
CO2: 29 mmol/L (ref 22–32)
CO2: 30 mmol/L (ref 22–32)
Calcium: 8.1 mg/dL — ABNORMAL LOW (ref 8.9–10.3)
Calcium: 7.8 mg/dL — ABNORMAL LOW (ref 8.9–10.3)
Chloride: 87 mmol/L — ABNORMAL LOW (ref 98–111)
Chloride: 90 mmol/L — ABNORMAL LOW (ref 98–111)
Creatinine, Ser: 0.53 mg/dL — ABNORMAL LOW (ref 0.61–1.24)
Creatinine, Ser: 0.6 mg/dL — ABNORMAL LOW (ref 0.61–1.24)
GFR calc Af Amer: >60 mL/min (ref >60)
GFR calc Af Amer: >60 mL/min (ref >60)
GFR calc non Af Amer: >60 mL/min (ref >60)
GFR calc non Af Amer: >60 mL/min (ref >60)
Glucose, Bld: 102 mg/dL — ABNORMAL HIGH (ref 70–99)
Glucose, Bld: 113 mg/dL — ABNORMAL HIGH (ref 70–99)
Potassium: 3.6 mmol/L (ref 3.5–5.1)
Potassium: 3.2 mmol/L — ABNORMAL LOW (ref 3.5–5.1)
Sodium: 126 mmol/L — ABNORMAL LOW (ref 135–145)
Sodium: 127 mmol/L — ABNORMAL LOW (ref 135–145)

## 2019-01-08 LAB — URINALYSIS, COMPLETE (UACMP) WITH MICROSCOPIC
Bacteria, UA: NONE SEEN
Bilirubin Urine: NEGATIVE
Glucose, UA: NEGATIVE mg/dL
Hgb urine dipstick: NEGATIVE
Ketones, ur: NEGATIVE mg/dL
Leukocytes,Ua: NEGATIVE
Nitrite: NEGATIVE
Protein, ur: NEGATIVE mg/dL
Specific Gravity, Urine: 1.003 — ABNORMAL LOW (ref 1.005–1.030)
Squamous Epithelial / HPF: NONE SEEN (ref 0–5)
WBC, UA: NONE SEEN WBC/hpf (ref 0–5)
pH: 7 (ref 5.0–8.0)

## 2019-01-08 LAB — CBC
HCT: 29.8 % — ABNORMAL LOW (ref 39.0–52.0)
Hemoglobin: 10.1 g/dL — ABNORMAL LOW (ref 13.0–17.0)
MCH: 32.9 pg (ref 26.0–34.0)
MCHC: 33.9 g/dL (ref 30.0–36.0)
MCV: 97.1 fL (ref 80.0–100.0)
Platelets: 236 10*3/uL (ref 150–400)
RBC: 3.07 MIL/uL — ABNORMAL LOW (ref 4.22–5.81)
RDW: 12.7 % (ref 11.5–15.5)
WBC: 12 10*3/uL — ABNORMAL HIGH (ref 4.0–10.5)
nRBC: 0 % (ref 0.0–0.2)

## 2019-01-08 LAB — ECHOCARDIOGRAM COMPLETE
Height: 66 in
Weight: 2287.85 oz

## 2019-01-08 LAB — MAGNESIUM
Magnesium: 1.6 mg/dL — ABNORMAL LOW (ref 1.7–2.4)
Magnesium: 1.6 mg/dL — ABNORMAL LOW (ref 1.7–2.4)
Magnesium: 1.6 mg/dL — ABNORMAL LOW (ref 1.7–2.4)

## 2019-01-08 LAB — TROPONIN I
Troponin I: 0.03 ng/mL (ref <0.03)
Troponin I: 0.03 ng/mL (ref <0.03)
Troponin I: <0.03 ng/mL (ref <0.03)
Troponin I: 0.05 ng/mL (ref <0.03)

## 2019-01-08 MED ORDER — DIGOXIN 0.25 MG/ML IJ SOLN
0.2500 mg | INTRAMUSCULAR | Status: AC
  Administered 2019-01-08: 01:00:00 0.25 mg via INTRAVENOUS
  Filled 2019-01-08: qty 2

## 2019-01-08 MED ORDER — MAGNESIUM SULFATE 2 GM/50ML IV SOLN
2.0000 g | Freq: Once | INTRAVENOUS | Status: AC
  Administered 2019-01-08: 2 g via INTRAVENOUS
  Filled 2019-01-08: qty 50

## 2019-01-08 MED ORDER — METOPROLOL TARTRATE 5 MG/5ML IV SOLN
5.0000 mg | Freq: Once | INTRAVENOUS | Status: AC
  Administered 2019-01-08: 5 mg via INTRAVENOUS

## 2019-01-08 MED ORDER — AMIODARONE HCL IN DEXTROSE 360-4.14 MG/200ML-% IV SOLN
30.0000 mg/h | INTRAVENOUS | Status: DC
  Administered 2019-01-08: 30 mg/h via INTRAVENOUS

## 2019-01-08 MED ORDER — AMIODARONE HCL IN DEXTROSE 360-4.14 MG/200ML-% IV SOLN
60.0000 mg/h | INTRAVENOUS | Status: DC
  Administered 2019-01-08 (×2): 60 mg/h via INTRAVENOUS
  Filled 2019-01-08 (×2): qty 200

## 2019-01-08 MED ORDER — METOPROLOL TARTRATE 5 MG/5ML IV SOLN
INTRAVENOUS | Status: AC
  Administered 2019-01-08: 5 mg via INTRAVENOUS
  Filled 2019-01-08: qty 5

## 2019-01-08 MED ORDER — MAGNESIUM SULFATE 2 GM/50ML IV SOLN
2.0000 g | Freq: Once | INTRAVENOUS | Status: AC
  Administered 2019-01-09: 2 g via INTRAVENOUS
  Filled 2019-01-08: qty 50

## 2019-01-08 MED ORDER — SODIUM CHLORIDE 0.9 % IV BOLUS
500.0000 mL | Freq: Once | INTRAVENOUS | Status: AC
  Administered 2019-01-08: 500 mL via INTRAVENOUS

## 2019-01-08 MED ORDER — AMIODARONE LOAD VIA INFUSION
150.0000 mg | Freq: Once | INTRAVENOUS | Status: AC
  Administered 2019-01-08: 150 mg via INTRAVENOUS
  Filled 2019-01-08: qty 83.34

## 2019-01-08 MED ORDER — SODIUM CHLORIDE 0.9 % IV SOLN
INTRAVENOUS | Status: DC
  Administered 2019-01-08: 07:00:00 via INTRAVENOUS

## 2019-01-08 MED ORDER — DILTIAZEM HCL 25 MG/5ML IV SOLN
5.0000 mg | Freq: Four times a day (QID) | INTRAVENOUS | Status: DC | PRN
  Administered 2019-01-08 (×2): 5 mg via INTRAVENOUS
  Filled 2019-01-08 (×2): qty 5

## 2019-01-08 MED ORDER — METOPROLOL SUCCINATE ER 50 MG PO TB24
50.0000 mg | ORAL_TABLET | Freq: Every day | ORAL | Status: DC
  Administered 2019-01-09 – 2019-01-10 (×2): 50 mg via ORAL
  Filled 2019-01-08 (×2): qty 1

## 2019-01-08 MED ORDER — DILTIAZEM HCL 25 MG/5ML IV SOLN
10.0000 mg | Freq: Once | INTRAVENOUS | Status: AC
  Administered 2019-01-08: 10 mg via INTRAVENOUS
  Filled 2019-01-08: qty 5

## 2019-01-08 NOTE — Progress Notes (Signed)
PT Cancellation Note  Patient Details Name: Darren Coleman MRN: [id number] DOB: 01/03/1951   Cancelled Treatment:    Reason Eval/Treat Not Completed: Medical issues which prohibited therapy   Chart reviewed.  Pt transferred to telemetry.  HR 150's  Will hold at this time and continue as appropriate.   Chesley Noon 01/08/2019, 8:26 AM

## 2019-01-08 NOTE — Progress Notes (Signed)
At 2355 notified that the pt. Bp was 86/65 HR 165 and sustaining. BP taking in the other arm was 96/69. Patient was asymptomatic of tachycardia. Notified A. Seals NP, Immediately call Rn and placed lab, EKG, telemetry and digoxin ordered. EKG performed 2.5mg  Digoxin given with no effective result still sustained HR 164. Notified A. Seals again who came to the bedside. 5mg  IV metoprolol ordered and given with no effective outcome. Patient still asymptomatic. Ordered cardiac surveillance/ Telememrty bed. Patient transferred to 2A.

## 2019-01-08 NOTE — Progress Notes (Signed)
*  PRELIMINARY RESULTS* Echocardiogram 2D Echocardiogram has been performed.  Wallie Char Mattisyn Cardona 01/08/2019, 9:09 AM

## 2019-01-08 NOTE — Progress Notes (Signed)
Pt HR on telemetry monitor is irregular 110-120. Pt is sleeping with no concerns offered. BP 108/67.

## 2019-01-08 NOTE — Progress Notes (Signed)
Pt remains on amiodarone drip with BP staying in the 95 to 102 range HR continues to sustain at 150. Pt is asymptomatic and has no pain or SOB. Pt is calm and cooperative. NP Seals made aware, IV mag to replace level of 1.6 ordered. Sodium of 126, normal saline ordered at 142ml/h.

## 2019-01-08 NOTE — Progress Notes (Signed)
2350- patient noted to be in atrial fibrillation with RVR with a blood pressure 96/69.  Patient was noted to be asymptomatic with no complaints of chest pain or shortness of breath.  No palpitations.  Patient tells me he had a similar episode of SVT after a colonoscopy procedure in October 2019 and was treated in the emergency room at [facility].  EKG was performed demonstrating supraventricular tachycardia with heart rate 164.  Patient received IV Lopressor 5 mg with no improvement.  Continued SVT with heart rate 1 50-1 64.  She remained asymptomatic.  Patient has been transferred to the telemetry unit for closer observation.  He has been started on amiodarone infusion.  Will get echocardiogram and cardiology consultation for further evaluation and recommendations.  We will continue every 6 hours troponin levels X3 And repeat EKG in the a.m.

## 2019-01-08 NOTE — Progress Notes (Signed)
pts HR remains afib RVR- 150s at rest- amio gtt currently infusing with no improvement/ Dr. Saunders Revel made aware

## 2019-01-08 NOTE — Progress Notes (Signed)
Patient has been on amiodarone drip with no improvement since last night.  Heart rate still in the 150s.  Will DC amiodarone drip.  1 push of IV Cardizem given and heart rate improved to 100. -We will continue IV Cardizem push PRN for now.  Cardiology has been notified. EKG showing atrial fibrillation

## 2019-01-08 NOTE — Progress Notes (Signed)
Pt woke up and HR went back to SVtach at 150.  No noted distress, pt asymptomatic

## 2019-01-08 NOTE — Progress Notes (Signed)
Patient transferred to new room. New nurse made aware of Troponin of 0.03

## 2019-01-08 NOTE — Progress Notes (Signed)
Subjective: 5 Days Post-Op Procedure(s) (LRB): INTRAMEDULLARY (IM) NAIL INTERTROCHANTRIC (Right) Patient reports only mild pain in the right hip this morning. Patient is well but has been transferred to the telemetry floor due to HR 150 and A fib with RVR last night.  Patient is asymptomatic Current plan is for discharge to SNF when medically appropriate. Negative for chest pain and shortness of breath Fever: no Gastrointestinal:Negative for nausea and vomiting  Objective: Vital signs in last 24 hours: Temp:  [97.6 F (36.4 C)-99.1 F (37.3 C)] 99.1 F (37.3 C) (06/22 0516) Pulse Rate:  [75-165] 155 (06/22 0700) Resp:  [18-20] 20 (06/21 2354) BP: (86-165)/(64-95) 103/76 (06/22 0700) SpO2:  [97 %-100 %] 99 % (06/22 0516) Weight:  [64.9 kg] 64.9 kg (06/22 0130)  Intake/Output from previous day:  Intake/Output Summary (Last 24 hours) at 01/08/2019 0757 Last data filed at 01/08/2019 0510 Gross per 24 hour  Intake 159.02 ml  Output 901 ml  Net -741.98 ml    Intake/Output this shift: No intake/output data recorded.  Labs: Recent Labs    01/05/19 1822 01/06/19 0504 01/07/19 0641 01/08/19 0048  HGB 10.3* 9.8* 9.7* 10.1*   Recent Labs    01/07/19 0641 01/08/19 0048  WBC 9.5 12.0*  RBC 2.95* 3.07*  HCT 28.7* 29.8*  PLT 204 236   Recent Labs    01/06/19 0504 01/08/19 0048  NA 131* 126*  K 3.9 3.6  CL 91* 87*  CO2 30 29  BUN 7* 10  CREATININE 0.39* 0.53*  GLUCOSE 95 102*  CALCIUM 8.5* 8.1*   No results for input(s): LABPT, INR in the last 72 hours. EXAM General - Patient is Alert, Appropriate and Oriented Extremity - ABD soft Sensation intact distally Intact pulses distally Dorsiflexion/Plantar flexion intact Incision: scant drainage No cellulitis present Dressing/Incision - Minimal bloody drainage to the right hip. Motor Function - intact, moving foot and toes well on exam.   Past Medical History:  Diagnosis Date  . Cancer ([place])   . COPD (chronic  obstructive pulmonary disease) (HCC)     Assessment/Plan: 5 Days Post-Op Procedure(s) (LRB): INTRAMEDULLARY (IM) NAIL INTERTROCHANTRIC (Right) Principal Problem:   Closed right hip fracture (HCC) Active Problems:   COPD (chronic obstructive pulmonary disease) (HCC)  Estimated body mass index is 23.08 kg/m as calculated from the following:   Height as of this encounter: 5\' 6"  (1.676 m).   Weight as of this encounter: 64.9 kg. Advance diet Up with therapy   Patient transferred to Telemetry floor this morning. Cardio has been consulted, plan for ECHO today. Patient has had a BM. Can work with PT if cleared today. No signs of infection to the right hip.   DVT Prophylaxis - Lovenox, Foot Pumps and TED hose Weight-Bearing as tolerated to right leg  J. Cameron Proud, PA-C [facility] Orthopaedic Surgery 01/08/2019, 7:57 AM

## 2019-01-08 NOTE — Care Management Important Message (Signed)
Important Message  Patient Details  Name: Darren Coleman MRN: [id number] Date of Birth: October 16, 1950   Medicare Important Message Given:  Yes     [name] 01/08/2019, 11:35 AM

## 2019-01-08 NOTE — Progress Notes (Signed)
[facility] [place] NAME: Darren Coleman    MR#:  [id number]  DATE OF BIRTH:  04/18/1951  SUBJECTIVE:  CHIEF COMPLAINT:   Chief Complaint  Patient presents with  . Hip Pain   -Postop day 5 after right hip surgery.    -Patient transferred to telemetry floor as he became tachycardic last night, heart rate sustaining in the 150s this morning.  Clinically feels about the same.  Slightly hypotensive.  Still has right hip pain  REVIEW OF SYSTEMS:  Review of Systems  Constitutional: Positive for malaise/fatigue. Negative for chills and fever.  HENT: Negative for congestion, hearing loss and nosebleeds.   Eyes: Negative for blurred vision and double vision.  Respiratory: Negative for cough, shortness of breath and wheezing.   Cardiovascular: Negative for chest pain and palpitations.  Gastrointestinal: Positive for constipation. Negative for abdominal pain, diarrhea, nausea and vomiting.  Genitourinary: Negative for dysuria.  Musculoskeletal: Positive for joint pain and myalgias.  Neurological: Negative for dizziness, focal weakness, seizures, weakness and headaches.  Psychiatric/Behavioral: Negative for depression.    DRUG ALLERGIES:   Allergies  Allergen Reactions  . Sulfa Antibiotics     VITALS:  Blood pressure 95/73, pulse (!) 153, temperature 98 F (36.7 C), temperature source Oral, resp. rate 20, height 5\' 6"  (1.676 m), weight 64.9 kg, SpO2 98 %.  PHYSICAL EXAMINATION:  Physical Exam   GENERAL:  67 y.o.-year-old patient sitting in the recliner, and seems to be in a lot of right hip pain.  EYES: Pupils equal, round, reactive to light and accommodation. No scleral icterus. Extraocular muscles intact.  HEENT: Head atraumatic, normocephalic. Oropharynx and nasopharynx clear.  NECK:  Supple, no jugular venous distention. No thyroid enlargement, no tenderness.  LUNGS: Improved breath sounds bilaterally, occasional rhonchi, no  wheezing, rales or crepitation. No use of accessory muscles of respiration.  CARDIOVASCULAR: S1, S2 rapid but regular. No murmurs, rubs, or gallops.  ABDOMEN: Soft, nontender, less distended. Bowel sounds present. No organomegaly or mass.  EXTREMITIES: Right hip dressing in place.  No pedal edema, cyanosis, or clubbing.  NEUROLOGIC: Cranial nerves II through XII are intact. Muscle strength 5/5 in all extremities except right lower extremity due to significant pain. Sensation intact. Gait not checked.  PSYCHIATRIC: The patient is alert and oriented x 3.  SKIN: No obvious rash, lesion, or ulcer.    LABORATORY PANEL:   CBC Recent Labs  Lab 01/08/19 0048  WBC 12.0*  HGB 10.1*  HCT 29.8*  PLT 236   ------------------------------------------------------------------------------------------------------------------  Chemistries  Recent Labs  Lab 01/02/19 2325  01/08/19 0048  NA 130*   < > 126*  K 4.0   < > 3.6  CL 92*   < > 87*  CO2 25   < > 29  GLUCOSE 85   < > 102*  BUN 8   < > 10  CREATININE 0.56*   < > 0.53*  CALCIUM 8.6*   < > 8.1*  MG  --   --  1.6*  AST 53*  --   --   ALT 56*  --   --   ALKPHOS 175*  --   --   BILITOT 0.6  --   --    < > = values in this interval not displayed.   ------------------------------------------------------------------------------------------------------------------  Cardiac Enzymes Recent Labs  Lab 01/08/19 0754  TROPONINI 0.03*   ------------------------------------------------------------------------------------------------------------------  RADIOLOGY:  No results found.  EKG:   Orders placed  or performed during the hospital encounter of 01/02/19  . ED EKG  . ED EKG  . EKG 12-Lead  . EKG 12-Lead  . EKG 12-Lead  . EKG 12-Lead    ASSESSMENT AND PLAN:   68 year old male with past medical history significant for COPD on 3 L chronic home oxygen presents to the emergency room secondary to fall and right hip pain.  1.  Sinus  tachycardia versus A. fib-unable to see the rhythm now as Coralyn Mark leads malfunctioning -Follow-up EKG.  Heart rate sustaining in the 150s.  Patient remains on amiodarone drip. -Hyponatremia and electrolytes.  Being replaced with saline.  Recheck later this afternoon. --Troponins are stable.  Echo is done and pending.  No fevers.  Repeat chest x-ray. -Not hypoxic less likely to be PE.  Monitor closely -Cardiology has been consulted  2.  Right hip intertrochanteric fracture-after a mechanical fall -Appreciate orthopedics consult.  Postop day 5 today  -Continue pain management- slow progression with pain control -Poor progress with physical therapy due to pain.  Will need rehab at discharge - on Lovenox for DVT prophylaxis.  3.   Abdominal distention-KUB showing ileus-symptoms resolved after bowel movement.  4.  Chronic respiratory failure-secondary to COPD on home oxygen.  Uses mostly at night times.  Here 2 to 3 L continuous use noted. -Continue inhalers, neb treatments and monitor.  No indication for systemic steroids  5.  Acute postop blood loss anemia-hemoglobin dropped after surgery and he received 2 units packed RBC transfusion and improved hemoglobin now. -Has elevated MCV and severely low vitamin B12 levels.  Replaced with intramuscular shot and started on oral supplements. -Also iron deficiency noted and oral iron started.  5.  DVT prophylaxis- on Lovenox.  Physical therapy consulted.  Likely rehab at discharge   All the records are reviewed and case discussed with Care Management/Social Workerr. Management plans discussed with the patient, family and they are in agreement.  CODE STATUS: Full code  TOTAL TIME TAKING CARE OF THIS PATIENT: 38 minutes.   POSSIBLE D/C in 1-2 days, DEPENDING ON CLINICAL CONDITION.   Gladstone Lighter M.D on 01/08/2019 at 10:29 AM  Between 7am to 6pm - Pager - [phone]  After 6pm go to [email or web address] - password [email or web address]  Office  [phone]  CC: Primary care physician; System, Pcp Not In

## 2019-01-09 DIAGNOSIS — L8990 Pressure ulcer of unspecified site, unspecified stage: Secondary | ICD-10-CM | POA: Insufficient documentation

## 2019-01-09 LAB — BASIC METABOLIC PANEL
Anion gap: 11 (ref 5–15)
BUN: 10 mg/dL (ref 8–23)
CO2: 29 mmol/L (ref 22–32)
Calcium: 7.9 mg/dL — ABNORMAL LOW (ref 8.9–10.3)
Chloride: 91 mmol/L — ABNORMAL LOW (ref 98–111)
Creatinine, Ser: 0.56 mg/dL — ABNORMAL LOW (ref 0.61–1.24)
GFR calc Af Amer: >60 mL/min (ref >60)
GFR calc non Af Amer: >60 mL/min (ref >60)
Glucose, Bld: 94 mg/dL (ref 70–99)
Potassium: 3 mmol/L — ABNORMAL LOW (ref 3.5–5.1)
Sodium: 131 mmol/L — ABNORMAL LOW (ref 135–145)

## 2019-01-09 LAB — MAGNESIUM: Magnesium: 2.2 mg/dL (ref 1.7–2.4)

## 2019-01-09 MED ORDER — APIXABAN 5 MG PO TABS
5.0000 mg | ORAL_TABLET | Freq: Two times a day (BID) | ORAL | Status: DC
  Administered 2019-01-09 – 2019-01-10 (×3): 5 mg via ORAL
  Filled 2019-01-09 (×3): qty 1

## 2019-01-09 MED ORDER — POTASSIUM CHLORIDE CRYS ER 20 MEQ PO TBCR
40.0000 meq | EXTENDED_RELEASE_TABLET | ORAL | Status: AC
  Administered 2019-01-09 (×2): 40 meq via ORAL
  Filled 2019-01-09 (×2): qty 2

## 2019-01-09 NOTE — Progress Notes (Signed)
[facility] [place] NAME: Darren Coleman    MR#:  [id number]  DATE OF BIRTH:  March 11, 1951  SUBJECTIVE:  CHIEF COMPLAINT:   Chief Complaint  Patient presents with  . Hip Pain   -Postop day 6 after right hip surgery.    -Patient got to telemetry floor due to uncontrolled A. fib with RVR.  Started on IV Cardizem pushes due to low blood pressure and also fluids.  Now converted to normal sinus rhythm early this a.m. -No other symptoms other than right hip pain  REVIEW OF SYSTEMS:  Review of Systems  Constitutional: Positive for malaise/fatigue. Negative for chills and fever.  HENT: Negative for congestion, hearing loss and nosebleeds.   Eyes: Negative for blurred vision and double vision.  Respiratory: Negative for cough, shortness of breath and wheezing.   Cardiovascular: Negative for chest pain and palpitations.  Gastrointestinal: Positive for constipation. Negative for abdominal pain, diarrhea, nausea and vomiting.  Genitourinary: Negative for dysuria.  Musculoskeletal: Positive for joint pain and myalgias.  Neurological: Negative for dizziness, focal weakness, seizures, weakness and headaches.  Psychiatric/Behavioral: Negative for depression.    DRUG ALLERGIES:   Allergies  Allergen Reactions  . Sulfa Antibiotics     VITALS:  Blood pressure 121/81, pulse (!) 113, temperature 97.9 F (36.6 C), temperature source Oral, resp. rate 20, height 5\' 6"  (1.676 m), weight 64.9 kg, SpO2 95 %.  PHYSICAL EXAMINATION:  Physical Exam   GENERAL:  68 y.o.-year-old patient sitting in the bed , and seems to be in a lot of right hip pain.  EYES: Pupils equal, round, reactive to light and accommodation. No scleral icterus. Extraocular muscles intact.  HEENT: Head atraumatic, normocephalic. Oropharynx and nasopharynx clear.  NECK:  Supple, no jugular venous distention. No thyroid enlargement, no tenderness.  LUNGS: Improved breath sounds  bilaterally, occasional rhonchi, no wheezing, rales or crepitation. No use of accessory muscles of respiration.  CARDIOVASCULAR: S1, S2 normal and regular . No murmurs, rubs, or gallops.  ABDOMEN: Soft, nontender, less distended. Bowel sounds present. No organomegaly or mass.  EXTREMITIES: Right hip dressing in place.  No pedal edema, cyanosis, or clubbing.  NEUROLOGIC: Cranial nerves II through XII are intact. Muscle strength 5/5 in all extremities except right lower extremity due to significant pain. Sensation intact. Gait not checked.  PSYCHIATRIC: The patient is alert and oriented x 3.  SKIN: No obvious rash, lesion, or ulcer.    LABORATORY PANEL:   CBC Recent Labs  Lab 01/08/19 0048  WBC 12.0*  HGB 10.1*  HCT 29.8*  PLT 236   ------------------------------------------------------------------------------------------------------------------  Chemistries  Recent Labs  Lab 01/02/19 2325  01/09/19 0548  NA 130*   < > 131*  K 4.0   < > 3.0*  CL 92*   < > 91*  CO2 25   < > 29  GLUCOSE 85   < > 94  BUN 8   < > 10  CREATININE 0.56*   < > 0.56*  CALCIUM 8.6*   < > 7.9*  MG  --    < > 2.2  AST 53*  --   --   ALT 56*  --   --   ALKPHOS 175*  --   --   BILITOT 0.6  --   --    < > = values in this interval not displayed.   ------------------------------------------------------------------------------------------------------------------  Cardiac Enzymes Recent Labs  Lab 01/08/19 1955  TROPONINI 0.05*   ------------------------------------------------------------------------------------------------------------------  RADIOLOGY:  Dg Chest Port 1 View  Result Date: 01/08/2019 CLINICAL DATA:  Postop day 5 RIGHT hip surgery.  Tachycardia. EXAM: PORTABLE CHEST 1 VIEW COMPARISON:  01/02/2019. FINDINGS: Unchanged cardiomediastinal silhouette calcified tortuous aorta. Elevated LEFT hemidiaphragm is stable. No consolidation. Decreased lung volumes. Small RIGHT effusion is  increased. Osteopenia. IMPRESSION: Small RIGHT effusion, but no consolidation or frank edema. Decreased lung volumes. Electronically Signed   By: Staci Righter M.D.   On: 01/08/2019 11:12    EKG:   Orders placed or performed during the hospital encounter of 01/02/19  . ED EKG  . ED EKG  . EKG 12-Lead  . EKG 12-Lead  . EKG 12-Lead  . EKG 12-Lead  . EKG 12-Lead  . EKG 12-Lead  . EKG 12-Lead  . EKG 12-Lead    ASSESSMENT AND PLAN:   68 year old male with past medical history significant for COPD on 3 L chronic home oxygen presents to the emergency room secondary to fall and right hip pain.  1.  New onset A. Fib with rvr-rate controlled, converted into normal sinus rhythm -Discontinue amiodarone drip.  Continue oral Toprol.  Not started on any other oral Cardizem medications due to low blood pressure -Discontinue IV fluids.  Echocardiogram is normal with EF 60 to 65%. -Cardiology has been consulted -Cardiology have recommended anticoagulation.  Discussed with Ortho and they are okay with it.  Started on Eliquis for anticoagulation for A. fib  2.  Right hip intertrochanteric fracture-after a mechanical fall -Appreciate orthopedics consult.  Postop day 5 today  -Continue pain management- slow progression with pain control -Poor progress with physical therapy due to pain.  Will need rehab at discharge - now on eliquis for DVT prophylaxis.  3.  Hypokalemia- replaced   4.  Chronic respiratory failure-secondary to COPD on home oxygen.  Uses mostly at night times.  Here 2 to 3 L continuous use noted. -Continue inhalers, neb treatments and monitor.  No indication for systemic steroids  5.  Acute postop blood loss anemia-hemoglobin dropped after surgery and he received 2 units packed RBC transfusion and improved hemoglobin now. -Has elevated MCV and severely low vitamin B12 levels.  Replaced with intramuscular shot and started on oral supplements. -Also iron deficiency noted and oral iron  started.  5.  DVT prophylaxis- on Eliquis now.  Physical therapy consulted.   rehab at discharge- possible discharge tomorrow if stable   All the records are reviewed and case discussed with Care Management/Social Workerr. Management plans discussed with the patient, family and they are in agreement.  CODE STATUS: Full code  TOTAL TIME TAKING CARE OF THIS PATIENT: 38 minutes.   POSSIBLE D/C tomorrow, DEPENDING ON CLINICAL CONDITION.   Gladstone Lighter M.D on 01/09/2019 at 1:43 PM  Between 7am to 6pm - Pager - [phone]  After 6pm go to [email or web address] - password [email or web address] [facility]  Office  [phone]  CC: Primary care physician; System, Pcp Not In

## 2019-01-09 NOTE — Progress Notes (Signed)
Physical Therapy Treatment Patient Details Name: Darren Coleman MRN: [id number] DOB: August 07, 1950 Today's Date: 01/09/2019    History of Present Illness presented to ER status post mechanical fall in home environment; admitted with R displaced intertrochanteric fracture, s/p L ORIF 6/17 ([facility])    PT Comments    Patient able/agreeable to initiate short-distance gait training (15') with RW, min/mod assist +2 for safety.  Fairly choppy with decreased stance time/weight acceptance R LE; multiple standing rest breaks throughout distance due to pain, fatigue.   HR elevation to 120s with exertion; recovers to low-100s with rest. Patient refused OOB to chair due to "open wound on my tail bone".  Unable to redirect/encourage despite education on position change, pressure relief.  Did encourage repositioning, turning in bed; patient voiced understanding, but remains in supine end of session.    Follow Up Recommendations  SNF     Equipment Recommendations       Recommendations for Other Services       Precautions / Restrictions Precautions Precautions: Fall Restrictions Weight Bearing Restrictions: Yes RLE Weight Bearing: Weight bearing as tolerated    Mobility  Bed Mobility Overal bed mobility: Needs Assistance Bed Mobility: Supine to Sit;Sit to Supine     Supine to sit: Mod assist Sit to supine: Mod assist   General bed mobility comments: constant assist for R LE management; limited tolerance for R knee flexion with position changes  Transfers Overall transfer level: Needs assistance Equipment used: Rolling walker (2 wheeled) Transfers: Sit to/from Stand Sit to Stand: +2 physical assistance;Min assist         General transfer comment: cuing for hand placement; slow and guarded; limited active use of R LE  Ambulation/Gait   Gait Distance (Feet): 15 Feet Assistive device: Rolling walker (2 wheeled)       General Gait Details: 3-point gait pattern with very short,  shuffling steps; limited stance time/weight acceptance R LE.  HR elevaation to 120s with exertion, recovers to low 100s with seated rest   Stairs             Wheelchair Mobility    Modified Rankin (Stroke Patients Only)       Balance Overall balance assessment: Needs assistance   Sitting balance-Leahy Scale: Fair Sitting balance - Comments: leans L to offset WBing through R hip   Standing balance support: Bilateral upper extremity supported Standing balance-Leahy Scale: Poor                              Cognition Arousal/Alertness: Awake/alert Behavior During Therapy: WFL for tasks assessed/performed Overall Cognitive Status: Within Functional Limits for tasks assessed                                        Exercises      General Comments        Pertinent Vitals/Pain Pain Assessment: Faces Faces Pain Scale: Hurts even more Pain Location: L hip with mobility Pain Descriptors / Indicators: Guarding;Grimacing;Operative site guarding;Sore;Crying Pain Intervention(s): Limited activity within patient's tolerance;Monitored during session;Premedicated before session;Repositioned    Home Living                      Prior Function            PT Goals (current goals can now be found in the care plan  section) Acute Rehab PT Goals Patient Stated Goal: to try to move around PT Goal Formulation: With patient Time For Goal Achievement: 01/18/19 Potential to Achieve Goals: Good Progress towards PT goals: Progressing toward goals    Frequency    BID      PT Plan Current plan remains appropriate    Co-evaluation              AM-PAC PT "6 Clicks" Mobility   Outcome Measure  Help needed turning from your back to your side while in a flat bed without using bedrails?: A Lot Help needed moving from lying on your back to sitting on the side of a flat bed without using bedrails?: A Lot Help needed moving to and from a  bed to a chair (including a wheelchair)?: A Lot Help needed standing up from a chair using your arms (e.g., wheelchair or bedside chair)?: A Lot Help needed to walk in hospital room?: A Lot Help needed climbing 3-5 steps with a railing? : A Lot 6 Click Score: 12    End of Session Equipment Utilized During Treatment: Gait belt Activity Tolerance: Patient tolerated treatment well Patient left: in bed;with bed alarm set;with call bell/phone within reach Nurse Communication: Mobility status PT Visit Diagnosis: Muscle weakness (generalized) (M62.81);History of falling (Z91.81);Difficulty in walking, not elsewhere classified (R26.2);Pain Pain - Right/Left: Right Pain - part of body: Hip     Time: 1438-8875 PT Time Calculation (min) (ACUTE ONLY): 25 min  Charges:  $Gait Training: 8-22 mins $Therapeutic Activity: 8-22 mins                     Harryette Shuart H. Owens Shark, PT, DPT, NCS 01/09/19, 11:02 PM [phone]

## 2019-01-09 NOTE — Consult Note (Signed)
Reason for Consult: Atrial fibrillation postop respiratory Referring Physician: Dr. Tressia Miners hospitalist  Darren Coleman is an 68 y.o. male.  HPI: Patient history of palpitations rapid atrial fibrillation postop from surgery patient had a fall at home fractured hip status post pinning by orthopedics now postop developed atrial fibrillation he is on one episode of atrial fibrillation in the past which resolved quickly after colonoscopy patient denies any chest pain minimal shortness of breath otherwise feels reasonably well patient was treated with amiodarone for rhythm and rate control with beta-blocker and calcium blocker now here for follow-up assessment  Past Medical History:  Diagnosis Date  . Cancer ([place])   . COPD (chronic obstructive pulmonary disease) ([place])     Past Surgical History:  Procedure Laterality Date  . COLON SURGERY    . INTRAMEDULLARY (IM) NAIL INTERTROCHANTERIC Right 01/03/2019   Procedure: INTRAMEDULLARY (IM) NAIL INTERTROCHANTRIC;  Surgeon: Corky Mull, MD;  Location: [facility] ORS;  Service: Orthopedics;  Laterality: Right;    History reviewed. No pertinent family history.  Social History:  reports that he has been smoking cigarettes. He has been smoking about 0.50 packs per day. He has never used smokeless tobacco. He reports current alcohol use. He reports that he does not use drugs.  Allergies:  Allergies  Allergen Reactions  . Sulfa Antibiotics     Medications: I have reviewed the patient's current medications.  Results for orders placed or performed during the hospital encounter of 01/02/19 (from the past 48 hour(s))  CBC     Status: Abnormal   Collection Time: 01/07/19  6:41 AM  Result Value Ref Range   WBC 9.5 4.0 - 10.5 K/uL   RBC 2.95 (L) 4.22 - 5.81 MIL/uL   Hemoglobin 9.7 (L) 13.0 - 17.0 g/dL   HCT 28.7 (L) 39.0 - 52.0 %   MCV 97.3 80.0 - 100.0 fL   MCH 32.9 26.0 - 34.0 pg   MCHC 33.8 30.0 - 36.0 g/dL   RDW 12.8 11.5 - 15.5 %   Platelets 204  150 - 400 K/uL   nRBC 0.0 0.0 - 0.2 %    Comment: Performed at [facility], [address] [place], [place] [address]  Basic metabolic panel     Status: Abnormal   Collection Time: 01/08/19 12:48 AM  Result Value Ref Range   Sodium 126 (L) 135 - 145 mmol/L   Potassium 3.6 3.5 - 5.1 mmol/L   Chloride 87 (L) 98 - 111 mmol/L   CO2 29 22 - 32 mmol/L   Glucose, Bld 102 (H) 70 - 99 mg/dL   BUN 10 8 - 23 mg/dL   Creatinine, Ser 0.53 (L) 0.61 - 1.24 mg/dL   Calcium 8.1 (L) 8.9 - 10.3 mg/dL   GFR calc non Af Amer >60 >60 mL/min   GFR calc Af Amer >60 >60 mL/min   Anion gap 10 5 - 15    Comment: Performed at [facility], [address] [place], [place] [address]  Troponin I - ONCE - STAT     Status: Abnormal   Collection Time: 01/08/19 12:48 AM  Result Value Ref Range   Troponin I 0.03 (HH) <0.03 ng/mL    Comment: CRITICAL RESULT CALLED TO, READ BACK BY AND VERIFIED WITH MATT PAGE AT 0127 01/08/2019.  TFK Performed at [facility], [address] [place], [place] [address]   Magnesium     Status: Abnormal   Collection Time: 01/08/19 12:48 AM  Result Value Ref  Range   Magnesium 1.6 (L) 1.7 - 2.4 mg/dL    Comment: Performed at [facility], [address] [place], [place] [address]  CBC     Status: Abnormal   Collection Time: 01/08/19 12:48 AM  Result Value Ref Range   WBC 12.0 (H) 4.0 - 10.5 K/uL   RBC 3.07 (L) 4.22 - 5.81 MIL/uL   Hemoglobin 10.1 (L) 13.0 - 17.0 g/dL   HCT 29.8 (L) 39.0 - 52.0 %   MCV 97.1 80.0 - 100.0 fL   MCH 32.9 26.0 - 34.0 pg   MCHC 33.9 30.0 - 36.0 g/dL   RDW 12.7 11.5 - 15.5 %   Platelets 236 150 - 400 K/uL   nRBC 0.0 0.0 - 0.2 %    Comment: Performed at [facility], [address] [place], [place] [address]  Troponin I - Now Then Q6H     Status: Abnormal   Collection Time: 01/08/19  7:54 AM  Result Value Ref Range   Troponin I 0.03 (HH) <0.03 ng/mL    Comment: CRITICAL VALUE NOTED. VALUE IS  CONSISTENT WITH PREVIOUSLY REPORTED/CALLED VALUE DAS Performed at [facility], [address] [place], [place] [address]   Urinalysis, Complete w Microscopic     Status: Abnormal   Collection Time: 01/08/19 11:02 AM  Result Value Ref Range   Color, Urine COLORLESS (A) YELLOW   APPearance CLEAR (A) CLEAR   Specific Gravity, Urine 1.003 (L) 1.005 - 1.030   pH 7.0 5.0 - 8.0   Glucose, UA NEGATIVE NEGATIVE mg/dL   Hgb urine dipstick NEGATIVE NEGATIVE   Bilirubin Urine NEGATIVE NEGATIVE   Ketones, ur NEGATIVE NEGATIVE mg/dL   Protein, ur NEGATIVE NEGATIVE mg/dL   Nitrite NEGATIVE NEGATIVE   Leukocytes,Ua NEGATIVE NEGATIVE   RBC / HPF 0-5 0 - 5 RBC/hpf   WBC, UA NONE SEEN 0 - 5 WBC/hpf   Bacteria, UA NONE SEEN NONE SEEN   Squamous Epithelial / LPF NONE SEEN 0 - 5    Comment: Performed at [facility], [address] [place], [place] [address]  Basic metabolic panel     Status: Abnormal   Collection Time: 01/08/19 12:39 PM  Result Value Ref Range   Sodium 127 (L) 135 - 145 mmol/L   Potassium 3.2 (L) 3.5 - 5.1 mmol/L   Chloride 90 (L) 98 - 111 mmol/L   CO2 30 22 - 32 mmol/L   Glucose, Bld 113 (H) 70 - 99 mg/dL   BUN 11 8 - 23 mg/dL   Creatinine, Ser 0.60 (L) 0.61 - 1.24 mg/dL   Calcium 7.8 (L) 8.9 - 10.3 mg/dL   GFR calc non Af Amer >60 >60 mL/min   GFR calc Af Amer >60 >60 mL/min   Anion gap 7 5 - 15    Comment: Performed at [facility], [address] [place], [place] [address]  Magnesium     Status: Abnormal   Collection Time: 01/08/19 12:39 PM  Result Value Ref Range   Magnesium 1.6 (L) 1.7 - 2.4 mg/dL    Comment: Performed at [facility], [address] [place], [place] [address]  Troponin I - Now Then Q6H     Status: None   Collection Time: 01/08/19  2:17 PM  Result Value Ref Range   Troponin I <0.03 <0.03 ng/mL    Comment: Performed at [facility], [address], [place], [place] [address]  Troponin I - Now Then  [facility]  Status: Abnormal   Collection Time: 01/08/19  7:55 PM  Result Value Ref Range   Troponin I 0.05 (HH) <0.03 ng/mL    Comment: CRITICAL RESULT CALLED TO, READ BACK BY AND VERIFIED WITH KATHRYN MURRAY @2048  01/08/19 MJU Performed at [facility], [address] [place], [place] [address]   Magnesium     Status: Abnormal   Collection Time: 01/08/19 10:54 PM  Result Value Ref Range   Magnesium 1.6 (L) 1.7 - 2.4 mg/dL    Comment: Performed at [facility], [address] [place], [place] [address]    Dg Chest Port 1 View  Result Date: 01/08/2019 CLINICAL DATA:  Postop day 5 RIGHT hip surgery.  Tachycardia. EXAM: PORTABLE CHEST 1 VIEW COMPARISON:  01/02/2019. FINDINGS: Unchanged cardiomediastinal silhouette calcified tortuous aorta. Elevated LEFT hemidiaphragm is stable. No consolidation. Decreased lung volumes. Small RIGHT effusion is increased. Osteopenia. IMPRESSION: Small RIGHT effusion, but no consolidation or frank edema. Decreased lung volumes. Electronically Signed   By: Staci Righter M.D.   On: 01/08/2019 11:12    Review of Systems  Constitutional: Negative.   HENT: Negative.   Eyes: Negative.   Respiratory: Negative.   Cardiovascular: Positive for palpitations.  Gastrointestinal: Negative.   Genitourinary: Negative.   Musculoskeletal: Positive for falls and joint pain.  Skin: Negative.   Neurological: Negative.   Endo/Heme/Allergies: Negative.   Psychiatric/Behavioral: Negative.    Blood pressure 102/79, pulse (!) 151, temperature 98.4 F (36.9 C), temperature source Oral, resp. rate 20, height 5\' 6"  (1.676 m), weight 64.9 kg, SpO2 98 %. Physical Exam  Nursing note and vitals reviewed. Constitutional: He is oriented to person, place, and time. He appears well-developed and well-nourished.  HENT:  Head: Normocephalic and atraumatic.  Eyes: Pupils are equal, round, and reactive to light. Conjunctivae and EOM are normal.  Neck: Normal range of  motion. Neck supple.  Cardiovascular: Normal pulses. An irregularly irregular rhythm present. Tachycardia present. PMI is displaced.  Murmur heard. Respiratory: Effort normal and breath sounds normal.  GI: Soft. Bowel sounds are normal.  Musculoskeletal: Normal range of motion.  Neurological: He is alert and oriented to person, place, and time. He has normal reflexes.  Skin: Skin is warm and dry.  Psychiatric: He has a normal mood and affect.    Assessment/Plan: Acute atrial fibrillation rapid ventricular response Postoperative surgery Palpitations Hip pain Anemia Leukocytosis COPD . Plan Agree with telemetry Agree with IV amiodarone continue rate control with beta-blocker calcium blocker Would recommend anticoagulation long-term will probably need Eliquis DVT prophylaxis Mild anemia recommend iron Inhalers for COPD Echocardiogram will be helpful for further assessment  Dwayne D Callwood 01/09/2019, 12:08 AM

## 2019-01-09 NOTE — Consult Note (Signed)
[place] for Apixaban Indication: atrial fibrillation  Allergies  Allergen Reactions  . Sulfa Antibiotics     Patient Measurements: Height: 5\' 6"  (167.6 cm) Weight: 142 lb 15.9 oz (64.9 kg) IBW/kg (Calculated) : 63.8  Vital Signs: Temp: 97.9 F (36.6 C) (06/23 0800) Temp Source: Oral (06/23 0800) BP: 121/81 (06/23 0800) Pulse Rate: 113 (06/23 1017)  Labs: Recent Labs    01/07/19 0641  01/08/19 0048 01/08/19 0754 01/08/19 1239 01/08/19 1417 01/08/19 1955 01/09/19 0548  HGB 9.7*  --  10.1*  --   --   --   --   --   HCT 28.7*  --  29.8*  --   --   --   --   --   PLT 204  --  236  --   --   --   --   --   CREATININE  --   --  0.53*  --  0.60*  --   --  0.56*  TROPONINI  --    < > 0.03* 0.03*  --  <0.03 0.05*  --    < > = values in this interval not displayed.    Estimated Creatinine Clearance: 82 mL/min (A) (by C-G formula based on SCr of 0.56 mg/dL (L)).   Medical History: Past Medical History:  Diagnosis Date  . Cancer ([place])   . COPD (chronic obstructive pulmonary disease) ([facility])     Medications:  Medications Prior to Admission  Medication Sig Dispense Refill Last Dose  . albuterol (PROVENTIL) (2.5 MG/3ML) 0.083% nebulizer solution Take 3 mLs by nebulization every 6 (six) hours as needed for wheezing or shortness of breath.   01/02/2019 at 2000  . albuterol (VENTOLIN HFA) 108 (90 Base) MCG/ACT inhaler Inhale 2 puffs into the lungs every 6 (six) hours as needed for wheezing.   01/02/2019 at 2000  . atorvastatin (LIPITOR) 40 MG tablet Take 40 mg by mouth daily.   01/02/2019 at 2000  . Fluticasone-Umeclidin-Vilant 100-62.5-25 MCG/INH AEPB Inhale 1 puff into the lungs daily.   01/02/2019 at 0800  . furosemide (LASIX) 40 MG tablet Take 40 mg by mouth daily.   01/02/2019 at 0800  . Saline (AYR SALINE NASAL DROPS) 0.65 % (Soln) SOLN Place 2 drops into both nostrils every 4 (four) hours.   prn at prn  . tadalafil (CIALIS) 5 MG tablet  Take 5 mg by mouth as needed.   prn at prn  . acetaminophen (TYLENOL) 500 MG tablet Take 500 mg by mouth every 6 (six) hours as needed for pain.   prn at prn  . aspirin EC 81 MG tablet Take 81 mg by mouth daily.   01/02/2019 at 0800  . methocarbamol (ROBAXIN) 500 MG tablet Take 500 mg by mouth 4 (four) times daily.   prn at prn  . oxyCODONE (OXY IR/ROXICODONE) 5 MG immediate release tablet Take 5 mg by mouth every 8 (eight) hours as needed for pain.   01/02/2019 at 2000  . predniSONE (DELTASONE) 10 MG tablet Take 10 mg by mouth daily.   01/02/2019 at 0800  . tiZANidine (ZANAFLEX) 4 MG tablet Take 4 mg by mouth every 6 (six) hours as needed for muscle spasms.   prn at prn   Scheduled:  . apixaban  5 mg Oral BID  . atorvastatin  40 mg Oral Daily  . cyanocobalamin  1,000 mcg Intramuscular Once  . docusate sodium  100 mg Oral BID  . ferrous sulfate  325 mg Oral BID WC  . fluticasone furoate-vilanterol  1 puff Inhalation Daily   And  . umeclidinium bromide  1 puff Inhalation Daily  . furosemide  40 mg Oral Daily  . ipratropium-albuterol  3 mL Nebulization TID  . metoprolol succinate  50 mg Oral Daily  . nicotine  14 mg Transdermal Daily  . predniSONE  10 mg Oral Daily  . traMADol  50 mg Oral Q6H  . vitamin B-12  500 mcg Oral Daily   Infusions:   PRN: acetaminophen, albuterol, albuterol, alum & mag hydroxide-simeth, bisacodyl, diltiazem, diphenhydrAMINE, HYDROmorphone (DILAUDID) injection, magnesium hydroxide, metoCLOPramide **OR** metoCLOPramide (REGLAN) injection, ondansetron **OR** ondansetron (ZOFRAN) IV, ondansetron **OR** ondansetron (ZOFRAN) IV, oxyCODONE, sodium phosphate, tiZANidine  Assessment: Pharmacy consulted to start apixaban for afib.   Goal of Therapy:  Monitor platelets by anticoagulation protocol: Yes   Plan:  Will start apixaban 5 mg BID - pt does not meet criteria for dose reduction. CBC stable.   Oswald Hillock, PharmD, BCPS 01/09/2019,1:10 PM

## 2019-01-09 NOTE — TOC Progression Note (Signed)
Transition of Care [facility]) - Progression Note    Patient Details  Name: Darren Coleman MRN: [id number] Date of Birth: 1951/07/13  [facility]) CM/SW Contact  Elza Rafter, RN Phone Number: 01/09/2019, 10:43 AM  Clinical Narrative:   Insurance approval for transition to [employer].  Per MD should discharge tomorrow as heart rate not controlled today.      Expected Discharge Plan: [place] Barriers to Discharge: Continued Medical Work up  Expected Discharge Plan and Services Expected Discharge Plan: [place] In-house Referral: Clinical Social Work Discharge Planning Services: CM Consult   Living arrangements for the past 2 months: Single Family Home                                       Social Determinants of Health (SDOH) Interventions    Readmission Risk Interventions No flowsheet data found.

## 2019-01-09 NOTE — Progress Notes (Signed)
CCMD reports pt has converted to NSR with HR of 90. Will continue to monitor

## 2019-01-09 NOTE — Progress Notes (Signed)
Stage 2 pressure injury noted to middle of sacral area / 1cm x 2cm / no drainage or odor noted/ area cleaned and foam placed/ MD aware/Q 2hr turn- pt stated that he can turn himself- will continue to encourage pt to turn frequently.

## 2019-01-09 NOTE — Progress Notes (Addendum)
Physical Therapy Treatment Patient Details Name: Darren Coleman MRN: [id number] DOB: January 05, 1951 Today's Date: 01/09/2019    History of Present Illness presented to ER status post mechanical fall in home environment; admitted with R displaced intertrochanteric fracture, s/p L ORIF 6/17 ([facility])    PT Comments    Marked improvement in pain control, muscle relaxation and overall movement tolerated throughout R LE.  Patient declined OOB this AM, stating MD requested "just move it around in bed this morning".  Will plan for OOB in PM  Of note, patient noted with transfer to telemetry previous date due to afib, rate in 150s (s/p amio drip, cardizem bolus); currently in NSR, rate in low-100s.  Cleared for continued participation with therapy.   Follow Up Recommendations  SNF     Equipment Recommendations  Rolling walker with 5" wheels    Recommendations for Other Services       Precautions / Restrictions Precautions Precautions: Fall Restrictions Weight Bearing Restrictions: Yes RLE Weight Bearing: Weight bearing as tolerated    Mobility  Bed Mobility               General bed mobility comments: patient declined OOB this AM, stating MD requested "just move it around in bed this morning".  Will plan for OOB in PM  Transfers                 General transfer comment: patient declined OOB this AM, stating MD requested "just move it around in bed this morning".  Will plan for OOB in PM  Ambulation/Gait             General Gait Details: patient declined OOB this AM, stating MD requested "just move it around in bed this morning".  Will plan for OOB in PM   Stairs             Wheelchair Mobility    Modified Rankin (Stroke Patients Only)       Balance                                            Cognition   Behavior During Therapy: WFL for tasks assessed/performed Overall Cognitive Status: Within Functional Limits for tasks  assessed                                        Exercises Other Exercises Other Exercises: Supine R LE therex, 2x15, AROM for muscular strength/endurance:  ankle pumps, quad sets, SAQs, heel slides, hip abduct/adduct.  Marked improvement in pain control, muscle relaxation and overall movemetn tolerated throughout R LE    General Comments        Pertinent Vitals/Pain Pain Assessment: 0-10 Pain Score: 8  Pain Location: L hip with mobility Pain Descriptors / Indicators: Guarding;Grimacing;Operative site guarding;Sore;Crying Pain Intervention(s): Limited activity within patient's tolerance;Monitored during session;Premedicated before session;Repositioned    Home Living                      Prior Function            PT Goals (current goals can now be found in the care plan section) Acute Rehab PT Goals Patient Stated Goal: to try to move around PT Goal Formulation: With patient Time For Goal Achievement: 01/18/19 Potential  to Achieve Goals: Good Progress towards PT goals: Progressing toward goals    Frequency    BID      PT Plan Current plan remains appropriate    Co-evaluation              AM-PAC PT "6 Clicks" Mobility   Outcome Measure  Help needed turning from your back to your side while in a flat bed without using bedrails?: A Lot Help needed moving from lying on your back to sitting on the side of a flat bed without using bedrails?: A Lot Help needed moving to and from a bed to a chair (including a wheelchair)?: A Lot Help needed standing up from a chair using your arms (e.g., wheelchair or bedside chair)?: A Lot Help needed to walk in hospital room?: A Lot Help needed climbing 3-5 steps with a railing? : Total 6 Click Score: 11    End of Session Equipment Utilized During Treatment: Oxygen Activity Tolerance: Patient tolerated treatment well Patient left: in bed;with call bell/phone within reach;with bed alarm set Nurse  Communication: Mobility status PT Visit Diagnosis: Muscle weakness (generalized) (M62.81);History of falling (Z91.81);Difficulty in walking, not elsewhere classified (R26.2);Pain Pain - Right/Left: Right Pain - part of body: Hip     Time: 4695-0722 PT Time Calculation (min) (ACUTE ONLY): 25 min  Charges:  $Therapeutic Exercise: 23-37 mins                     Ajane Novella H. Owens Shark, PT, DPT, NCS 01/09/19, 10:23 AM [phone]

## 2019-01-10 LAB — BASIC METABOLIC PANEL
Anion gap: 7 (ref 5–15)
BUN: 8 mg/dL (ref 8–23)
CO2: 30 mmol/L (ref 22–32)
Calcium: 8.4 mg/dL — ABNORMAL LOW (ref 8.9–10.3)
Chloride: 94 mmol/L — ABNORMAL LOW (ref 98–111)
Creatinine, Ser: 0.49 mg/dL — ABNORMAL LOW (ref 0.61–1.24)
GFR calc Af Amer: >60 mL/min (ref >60)
GFR calc non Af Amer: >60 mL/min (ref >60)
Glucose, Bld: 94 mg/dL (ref 70–99)
Potassium: 3.9 mmol/L (ref 3.5–5.1)
Sodium: 131 mmol/L — ABNORMAL LOW (ref 135–145)

## 2019-01-10 LAB — CBC
HCT: 28.8 % — ABNORMAL LOW (ref 39.0–52.0)
Hemoglobin: 9.6 g/dL — ABNORMAL LOW (ref 13.0–17.0)
MCH: 33.4 pg (ref 26.0–34.0)
MCHC: 33.3 g/dL (ref 30.0–36.0)
MCV: 100.3 fL — ABNORMAL HIGH (ref 80.0–100.0)
Platelets: 296 10*3/uL (ref 150–400)
RBC: 2.87 MIL/uL — ABNORMAL LOW (ref 4.22–5.81)
RDW: 12.6 % (ref 11.5–15.5)
WBC: 9.9 10*3/uL (ref 4.0–10.5)
nRBC: 0 % (ref 0.0–0.2)

## 2019-01-10 LAB — SARS CORONAVIRUS 2 BY RT PCR (HOSPITAL ORDER, PERFORMED IN ~~LOC~~ HOSPITAL LAB): SARS Coronavirus 2: NEGATIVE

## 2019-01-10 MED ORDER — METOPROLOL SUCCINATE ER 50 MG PO TB24: 50.0000 mg | ORAL_TABLET | Freq: Every day | ORAL | 0 refills | Status: DC

## 2019-01-10 MED ORDER — FERROUS SULFATE 325 (65 FE) MG PO TABS: 325.0000 mg | ORAL_TABLET | Freq: Two times a day (BID) | ORAL | 0 refills | Status: DC

## 2019-01-10 MED ORDER — VITAMIN B-12 100 MCG PO TABS: 100.0000 ug | ORAL_TABLET | Freq: Every day | ORAL | 0 refills | Status: AC

## 2019-01-10 MED ORDER — APIXABAN 5 MG PO TABS: 5.0000 mg | ORAL_TABLET | Freq: Two times a day (BID) | ORAL | 0 refills | Status: DC

## 2019-01-10 MED ORDER — IPRATROPIUM-ALBUTEROL 0.5-2.5 (3) MG/3ML IN SOLN: 3.0000 mL | Freq: Four times a day (QID) | RESPIRATORY_TRACT | Status: DC | PRN

## 2019-01-10 NOTE — TOC Transition Note (Signed)
Transition of Care [facility]) - CM/SW Discharge Note   Patient Details  Name: Darren Coleman MRN: [id number] Date of Birth: January 14, 1951  [facility]) CM/SW Contact:  Elza Rafter, RN Phone Number: 01/10/2019, 12:33 PM   Clinical Narrative:   [facility].  Patient and spouse are both aware.  COVID pending and once results patient can transition to [facility] room 713.  Packet placed on Chart.  Janett Billow RN aware.      Final next level of care: Skilled Nursing Facility Barriers to Discharge: No Barriers Identified   Patient Goals and CMS Choice Patient states their goals for this hospitalization and ongoing recovery are:: Pain control.      Discharge Placement                       Discharge Plan and Services In-house Referral: Clinical Social Work Discharge Planning Services: CM Consult                                 Social Determinants of Health (SDOH) Interventions     Readmission Risk Interventions Readmission Risk Prevention Plan 01/10/2019  Transportation Screening Complete  PCP or Specialist Appt within 5-7 Days Not Complete  Not Complete comments discharging to [place] Screening Complete  Medication Review (RN CM) Complete  Some recent data might be hidden

## 2019-01-10 NOTE — Discharge Summary (Signed)
Darren Coleman, is a 68 y.o. male  DOB [birth year]  MRN [id number].  Admission date:  01/02/2019  Admitting Physician  Lance Coon, MD  Discharge Date:  01/10/2019   Primary MD  System, Pcp Not In  Recommendations for primary care physician for things to follow:   Follow-up with [facility] in 1 week.  Admission Diagnosis  Right hip pain [M25.551] Fall, initial encounter [W19.XXXA] Closed displaced intertrochanteric fracture of right femur, initial encounter [facility]) [S72.141A]   Discharge Diagnosis  Right hip pain [M25.551] Fall, initial encounter [W19.XXXA] Closed displaced intertrochanteric fracture of right femur, initial encounter ([place]) [S72.141A]    Principal Problem:   Closed right hip fracture ([place]) Active Problems:   COPD (chronic obstructive pulmonary disease) (H[facility])   Pressure injury of skin      Past Medical History:  Diagnosis Date  . Cancer ([place])   . COPD (chronic obstructive pulmonary disease) ([place])     Past Surgical History:  Procedure Laterality Date  . COLON SURGERY    . INTRAMEDULLARY (IM) NAIL INTERTROCHANTERIC Right 01/03/2019   Procedure: INTRAMEDULLARY (IM) NAIL INTERTROCHANTRIC;  Surgeon: Corky Mull, MD;  Location: [facility] ORS;  Service: Orthopedics;  Laterality: Right;       History of present illness and  Hospital Course:     Kindly see H&P for history of present illness and admission details, please review complete Labs, Consult reports and Test reports for all details in brief  HPI  from the history and physical done on the day of admission 68 year old male with history of COPD on 2 L of oxygen chronically comes in because of fall at home and suffered right hip fracture and admitted for the same.   Hospital Course  #1 right hip intertrochanteric fracture after mechanical fall, status  post surgery, 2 days postop day 6, patient tolerating physical therapy, pain is well controlled.  Patient will go to [facility]. 2.  New onset atrial fibrillation with RVR, patient developed A. fib with RVR, transferred from Ortho floor to telemetry floor and patient initially required amiodarone drip, now changed to beta-blockers.  Patient echocardiogram showed EF 60 to 65%.  Seen by cardiology, patient is started on Eliquis for full anticoagulation.  Patient can continue Eliquis. 3.  Hypokalemia, replaced. 4.  Chronic respiratory failure secondary to COPD, patient on 2 L of oxygen. 5.  Acute postop blood loss anemia, patient required 2 units of packed RBC transfusion.  Patient hemoglobin is stable. 6.  Severe B12 deficiency anemia, required intramuscular B12 shots, now better. 7.  Iron deficiency anemia, iron started orally. Waiting for rapid COVID-19 test for discharging to nursing home as per nursing home policy.    Discharge Condition: Stable   Follow UP   Contact information for follow-up providers    Lattie Corns, PA-C. Schedule an appointment as soon as possible for a visit in 2 week(s).   Specialty: Physician Assistant Why: For staple removal Contact information: [place] [phone]            Contact information for after-discharge care    Destination    [facility] Preferred SNF .   Service: Skilled Nursing Contact information: [address] [place] [phone]                    Discharge Instructions  and  Discharge Medications      Allergies as of 01/10/2019  Reactions   Sulfa Antibiotics       Medication List    TAKE these medications   acetaminophen 500 MG tablet Commonly known as: TYLENOL Take 500 mg by mouth every 6 (six) hours as needed for pain.   albuterol 108 (90 Base) MCG/ACT inhaler Commonly known as:  VENTOLIN HFA Inhale 2 puffs into the lungs every 6 (six) hours as needed for wheezing.   albuterol (2.5 MG/3ML) 0.083% nebulizer solution Commonly known as: PROVENTIL Take 3 mLs by nebulization every 6 (six) hours as needed for wheezing or shortness of breath.   apixaban 5 MG Tabs tablet Commonly known as: ELIQUIS Take 1 tablet (5 mg total) by mouth 2 (two) times daily.   aspirin EC 81 MG tablet Take 81 mg by mouth daily.   atorvastatin 40 MG tablet Commonly known as: LIPITOR Take 40 mg by mouth daily.   [place] Saline Nasal Drops 0.65 % (Soln) Soln Generic drug: Saline Place 2 drops into both nostrils every 4 (four) hours.   ferrous sulfate 325 (65 FE) MG tablet Take 1 tablet (325 mg total) by mouth 2 (two) times daily with a meal.   Fluticasone-Umeclidin-Vilant 100-62.5-25 MCG/INH Aepb Inhale 1 puff into the lungs daily.   furosemide 40 MG tablet Commonly known as: LASIX Take 40 mg by mouth daily.   methocarbamol 500 MG tablet Commonly known as: ROBAXIN Take 500 mg by mouth 4 (four) times daily.   metoprolol succinate 50 MG 24 hr tablet Commonly known as: TOPROL-XL Take 1 tablet (50 mg total) by mouth daily. Take with or immediately following a meal. Start taking on: January 11, 2019   oxyCODONE 5 MG immediate release tablet Commonly known as: Oxy IR/ROXICODONE Take 1-2 tablets (5-10 mg total) by mouth every 4 (four) hours as needed for moderate pain (pain score 4-6). What changed:   how much to take  when to take this  reasons to take this   predniSONE 10 MG tablet Commonly known as: DELTASONE Take 10 mg by mouth daily.   tadalafil 5 MG tablet Commonly known as: CIALIS Take 5 mg by mouth as needed.   tiZANidine 4 MG tablet Commonly known as: ZANAFLEX Take 4 mg by mouth every 6 (six) hours as needed for muscle spasms.   traMADol 50 MG tablet Commonly known as: ULTRAM Take 1 tablet (50 mg total) by mouth every 6 (six) hours.   vitamin B-12 100 MCG  tablet Commonly known as: CYANOCOBALAMIN Take 1 tablet (100 mcg total) by mouth daily for 30 days.         Diet and Activity recommendation: See Discharge Instructions above   Consults obtained -orthopedic, cardiology   Major procedures and Radiology Reports - PLEASE review detailed and final reports for all details, in brief -     Dg Chest 1 View  Result Date: 01/02/2019 CLINICAL DATA:  Post fall. Right hip fracture. Preop. EXAM: CHEST  1 VIEW COMPARISON:  Radiograph 08/26/2008 FINDINGS: The lungs are hyperinflated. Heart is normal in size. Aortic tortuosity and atherosclerosis. Multiple skin folds project over the right hemithorax and axilla. No focal airspace disease, pulmonary edema, large pleural effusion or pneumothorax. No acute osseous abnormalities are seen. IMPRESSION: 1. Hyperinflation without acute abnormality. 2. Aortic tortuosity and atherosclerosis. 3. Multiple skin folds project over the right hemithorax and axilla. Electronically Signed   By: Keith Rake M.D.   On: 01/02/2019 23:56   Dg Abd 1 View  Result Date: 01/05/2019 CLINICAL DATA:  Ileus distension EXAM:  ABDOMEN - 1 VIEW COMPARISON:  05/07/2009 FINDINGS: Mild to moderate diffuse gaseous distention of large and small bowel with gas down to the rectum. Mild stool in the colon. Postsurgical changes of the right hip partially visualized. IMPRESSION: Mild to moderate diffuse gaseous distention of the bowel, favor ileus. Electronically Signed   By: Donavan Foil M.D.   On: 01/05/2019 16:19   Dg Chest Port 1 View  Result Date: 01/08/2019 CLINICAL DATA:  Postop day 5 RIGHT hip surgery.  Tachycardia. EXAM: PORTABLE CHEST 1 VIEW COMPARISON:  01/02/2019. FINDINGS: Unchanged cardiomediastinal silhouette calcified tortuous aorta. Elevated LEFT hemidiaphragm is stable. No consolidation. Decreased lung volumes. Small RIGHT effusion is increased. Osteopenia. IMPRESSION: Small RIGHT effusion, but no consolidation or frank  edema. Decreased lung volumes. Electronically Signed   By: Staci Righter M.D.   On: 01/08/2019 11:12   Dg Hip Operative Unilat W Or W/o Pelvis Right  Result Date: 01/03/2019 CLINICAL DATA:  Right hip fracture EXAM: OPERATIVE right HIP (WITH PELVIS IF PERFORMED) 8 VIEWS TECHNIQUE: Fluoroscopic spot image(s) were submitted for interpretation post-operatively. COMPARISON:  01/02/2019 FINDINGS: Eight low resolution intraoperative spot views of the right hip. Total fluoroscopy time was 1 minutes 6 seconds. Initial images demonstrate right intertrochanteric fracture. Subsequent images demonstrate placement of intramedullary rod and distal fixating screw within the femur. IMPRESSION: Intraoperative fluoroscopic assistance provided during surgical fixation of right femur fracture Electronically Signed   By: Donavan Foil M.D.   On: 01/03/2019 22:26   Dg Hip Unilat With Pelvis 2-3 Views Right  Result Date: 01/02/2019 CLINICAL DATA:  Fall with right hip pain. EXAM: DG HIP (WITH OR WITHOUT PELVIS) 2-3V RIGHT COMPARISON:  None. FINDINGS: Displaced intertrochanteric right femur fracture involves both greater and lesser trochanters. Mild rotational component. The femoral head remains seated. The remainder the pelvis is intact. No additional acute fracture. Bones are under mineralized. Pubic symphysis and sacroiliac joints are congruent. Surgical sutures project over the lower abdomen. Chain sutures in the pelvis. IMPRESSION: Displaced intertrochanteric right femur fracture. Electronically Signed   By: Keith Rake M.D.   On: 01/02/2019 23:54    Micro Results     Recent Results (from the past 240 hour(s))  SARS Coronavirus 2 (CEPHEID - Performed in [place] hospital lab), Hosp Order     Status: None   Collection Time: 01/03/19 12:27 AM   Specimen: Nasopharyngeal Swab  Result Value Ref Range Status   SARS Coronavirus 2 NEGATIVE NEGATIVE Final    Comment: (NOTE) If result is NEGATIVE SARS-CoV-2 target  nucleic acids are NOT DETECTED. The SARS-CoV-2 RNA is generally detectable in upper and lower  respiratory specimens during the acute phase of infection. The lowest  concentration of SARS-CoV-2 viral copies this assay can detect is 250  copies / mL. A negative result does not preclude SARS-CoV-2 infection  and should not be used as the sole basis for treatment or other  patient management decisions.  A negative result may occur with  improper specimen collection / handling, submission of specimen other  than nasopharyngeal swab, presence of viral mutation(s) within the  areas targeted by this assay, and inadequate number of viral copies  (<250 copies / mL). A negative result must be combined with clinical  observations, patient history, and epidemiological information. If result is POSITIVE SARS-CoV-2 target nucleic acids are DETECTED. The SARS-CoV-2 RNA is generally detectable in upper and lower  respiratory specimens dur ing the acute phase of infection.  Positive  results are indicative of active infection  with SARS-CoV-2.  Clinical  correlation with patient history and other diagnostic information is  necessary to determine patient infection status.  Positive results do  not rule out bacterial infection or co-infection with other viruses. If result is PRESUMPTIVE POSTIVE SARS-CoV-2 nucleic acids MAY BE PRESENT.   A presumptive positive result was obtained on the submitted specimen  and confirmed on repeat testing.  While 2019 novel coronavirus  (SARS-CoV-2) nucleic acids may be present in the submitted sample  additional confirmatory testing may be necessary for epidemiological  and / or clinical management purposes  to differentiate between  SARS-CoV-2 and other Sarbecovirus currently known to infect humans.  If clinically indicated additional testing with an alternate test  methodology [phone]) is advised. The SARS-CoV-2 RNA is generally  detectable in upper and lower  respiratory sp ecimens during the acute  phase of infection. The expected result is Negative. Fact Sheet for Patients:  StrictlyIdeas.no Fact Sheet for Healthcare Providers: BankingDealers.co.za This test is not yet approved or cleared by the [place] FDA and has been authorized for detection and/or diagnosis of SARS-CoV-2 by FDA under an Emergency Use Authorization (EUA).  This EUA will remain in effect (meaning this test can be used) for the duration of the COVID-19 declaration under Section 564(b)(1) of the Act, 21 U.S.C. section 360bbb-3(b)(1), unless the authorization is terminated or revoked sooner. Performed at [facility], [address], [place], [place] [address]   Surgical pcr screen     Status: None   Collection Time: 01/03/19  1:55 AM   Specimen: Nasal Mucosa; Nasal Swab  Result Value Ref Range Status   MRSA, PCR NEGATIVE NEGATIVE Final   Staphylococcus aureus NEGATIVE NEGATIVE Final    Comment: (NOTE) The Xpert SA Assay (FDA approved for NASAL specimens in patients 28 years of age and older), is one component of a comprehensive surveillance program. It is not intended to diagnose infection nor to guide or monitor treatment. Performed at [facility], [address], [place], [place] [address]   SARS Coronavirus 2 (CEPHEID - Performed in [facility] hospital lab), Hosp Order     Status: None   Collection Time: 01/10/19 10:27 AM   Specimen: Nasopharyngeal Swab  Result Value Ref Range Status   SARS Coronavirus 2 NEGATIVE NEGATIVE Final    Comment: (NOTE) If result is NEGATIVE SARS-CoV-2 target nucleic acids are NOT DETECTED. The SARS-CoV-2 RNA is generally detectable in upper and lower  respiratory specimens during the acute phase of infection. The lowest  concentration of SARS-CoV-2 viral copies this assay can detect is 250  copies / mL. A negative result does not preclude SARS-CoV-2  infection  and should not be used as the sole basis for treatment or other  patient management decisions.  A negative result may occur with  improper specimen collection / handling, submission of specimen other  than nasopharyngeal swab, presence of viral mutation(s) within the  areas targeted by this assay, and inadequate number of viral copies  (<250 copies / mL). A negative result must be combined with clinical  observations, patient history, and epidemiological information. If result is POSITIVE SARS-CoV-2 target nucleic acids are DETECTED. The SARS-CoV-2 RNA is generally detectable in upper and lower  respiratory specimens dur ing the acute phase of infection.  Positive  results are indicative of active infection with SARS-CoV-2.  Clinical  correlation with patient history and other diagnostic information is  necessary to determine patient infection status.  Positive results do  not rule  out bacterial infection or co-infection with other viruses. If result is PRESUMPTIVE POSTIVE SARS-CoV-2 nucleic acids MAY BE PRESENT.   A presumptive positive result was obtained on the submitted specimen  and confirmed on repeat testing.  While 2019 novel coronavirus  (SARS-CoV-2) nucleic acids may be present in the submitted sample  additional confirmatory testing may be necessary for epidemiological  and / or clinical management purposes  to differentiate between  SARS-CoV-2 and other Sarbecovirus currently known to infect humans.  If clinically indicated additional testing with an alternate test  methodology [phone]) is advised. The SARS-CoV-2 RNA is generally  detectable in upper and lower respiratory sp ecimens during the acute  phase of infection. The expected result is Negative. Fact Sheet for Patients:  StrictlyIdeas.no Fact Sheet for Healthcare Providers: BankingDealers.co.za This test is not yet approved or cleared by the [place]  FDA and has been authorized for detection and/or diagnosis of SARS-CoV-2 by FDA under an Emergency Use Authorization (EUA).  This EUA will remain in effect (meaning this test can be used) for the duration of the COVID-19 declaration under Section 564(b)(1) of the Act, 21 U.S.C. section 360bbb-3(b)(1), unless the authorization is terminated or revoked sooner. Performed at [facility], [address] [place], [place] [address]        Today   Subjective:   Darren Coleman is stable for discharge to [facility].  Objective:   Blood pressure 119/77, pulse (!) 115, temperature 98.1 F (36.7 C), temperature source Oral, resp. rate 20, height 5\' 6"  (1.676 m), weight 64.9 kg, SpO2 100 %.   Intake/Output Summary (Last 24 hours) at 01/10/2019 1326 Last data filed at 01/10/2019 1007 Gross per 24 hour  Intake 240 ml  Output 1750 ml  Net -1510 ml    Exam Awake Alert, Oriented x 3, No new F.N deficits, Normal affect [place].AT,PERRAL Supple Neck,No JVD, No cervical lymphadenopathy appriciated.  Symmetrical Chest wall movement, Good air movement bilaterally, CTAB RRR,No Gallops,Rubs or new Murmurs, No Parasternal Heave +ve B.Sounds, Abd Soft, Non tender, No organomegaly appriciated, No rebound -guarding or rigidity. No Cyanosis, Clubbing or edema, No new Rash or bruise  Data Review   CBC w Diff:  Lab Results  Component Value Date   WBC 9.9 01/10/2019   HGB 9.6 (L) 01/10/2019   HCT 28.8 (L) 01/10/2019   PLT 296 01/10/2019   LYMPHOPCT 20 01/02/2019   MONOPCT 10 01/02/2019   EOSPCT 4 01/02/2019   BASOPCT 1 01/02/2019    CMP:  Lab Results  Component Value Date   NA 131 (L) 01/10/2019   K 3.9 01/10/2019   CL 94 (L) 01/10/2019   CO2 30 01/10/2019   BUN 8 01/10/2019   CREATININE 0.49 (L) 01/10/2019   PROT 6.5 01/02/2019   ALBUMIN 3.6 01/02/2019   BILITOT 0.6 01/02/2019   ALKPHOS 175 (H) 01/02/2019   AST 53 (H) 01/02/2019   ALT 56 (H) 01/02/2019   .   Total Time in preparing paper work, data evaluation and todays exam - 45 minutes  Epifanio Lesches M.D on 01/10/2019 at 1:26 PM    Note: This dictation was prepared with Dragon dictation along with smaller phrase technology. Any transcriptional errors that result from this process are unintentional.

## 2019-01-10 NOTE — Progress Notes (Signed)
Pt DC to SNF via EMS. Pt is A&O , Eliquis, colace, and a neb. Treatment administered before dc.

## 2019-01-10 NOTE — Progress Notes (Signed)
Discharge report called to [facility] / iv and tele removed/ EMS called to transport

## 2019-01-10 NOTE — Progress Notes (Signed)
Physical Therapy Treatment Patient Details Name: Darren Coleman MRN: [id number] DOB: 29-Jul-1950 Today's Date: 01/10/2019    History of Present Illness presented to ER status post mechanical fall in home environment; admitted with R displaced intertrochanteric fracture, s/p L ORIF 6/17 ([facility])    PT Comments    Progressive increase in gait distance, with improvement in bilat LE step height/length and overall cadence/gait fluidity. Gradual improvement in comfort and cadence with mobility. HR maintained 100-110s throughout session. Patient very encouraged by progress.    Follow Up Recommendations  SNF     Equipment Recommendations       Recommendations for Other Services       Precautions / Restrictions Precautions Precautions: Fall Restrictions Weight Bearing Restrictions: Yes RLE Weight Bearing: Weight bearing as tolerated    Mobility  Bed Mobility Overal bed mobility: Needs Assistance Bed Mobility: Supine to Sit;Sit to Supine     Supine to sit: Min assist Sit to supine: Min assist   General bed mobility comments: constant assist for R LE management; limited tolerance for R knee flexion with position changes  Transfers Overall transfer level: Needs assistance Equipment used: Rolling walker (2 wheeled) Transfers: Sit to/from Stand Sit to Stand: Min guard;Min assist         General transfer comment: cuing for hand placement; slow and guarded; limited active use of R LE  Ambulation/Gait Ambulation/Gait assistance: Min assist Gait Distance (Feet): 25 Feet Assistive device: Rolling walker (2 wheeled)       General Gait Details: 3-point, step to gait pattern; min cuing for increased step length and proper walker placement throughout gait cycle; encouraged for continuous stepping pattern as able to maximize fluidity.  HR 110s with exertion   Stairs             Wheelchair Mobility    Modified Rankin (Stroke Patients Only)       Balance Overall  balance assessment: Needs assistance Sitting-balance support: No upper extremity supported;Feet supported Sitting balance-Leahy Scale: Good     Standing balance support: Bilateral upper extremity supported Standing balance-Leahy Scale: Fair                              Cognition Arousal/Alertness: Awake/alert Behavior During Therapy: WFL for tasks assessed/performed Overall Cognitive Status: Within Functional Limits for tasks assessed                                        Exercises Other Exercises Other Exercises: Seated R LE therex, 1x10, AROM for muscular strength/endurance: ankle pumps, LAQs (with knee flexion), iso hip adduct.  Improving tolerance and active control with R hip/knee movement    General Comments        Pertinent Vitals/Pain Pain Assessment: Faces Faces Pain Scale: Hurts even more Pain Location: R hip with mobility Pain Descriptors / Indicators: Guarding;Grimacing;Operative site guarding;Sore;Crying Pain Intervention(s): Limited activity within patient's tolerance;Monitored during session;Premedicated before session;Repositioned    Home Living                      Prior Function            PT Goals (current goals can now be found in the care plan section) Acute Rehab PT Goals Patient Stated Goal: to try to move around PT Goal Formulation: With patient Time For Goal Achievement: 01/18/19 Potential  to Achieve Goals: Good Progress towards PT goals: Progressing toward goals    Frequency    BID      PT Plan Current plan remains appropriate    Co-evaluation              AM-PAC PT "6 Clicks" Mobility   Outcome Measure  Help needed turning from your back to your side while in a flat bed without using bedrails?: A Little Help needed moving from lying on your back to sitting on the side of a flat bed without using bedrails?: A Little Help needed moving to and from a bed to a chair (including a  wheelchair)?: A Little Help needed standing up from a chair using your arms (e.g., wheelchair or bedside chair)?: A Little Help needed to walk in hospital room?: A Little Help needed climbing 3-5 steps with a railing? : A Lot 6 Click Score: 17    End of Session Equipment Utilized During Treatment: Gait belt Activity Tolerance: Patient tolerated treatment well Patient left: in bed;with call bell/phone within reach;with bed alarm set Nurse Communication: Mobility status PT Visit Diagnosis: Muscle weakness (generalized) (M62.81);History of falling (Z91.81);Difficulty in walking, not elsewhere classified (R26.2);Pain Pain - Right/Left: Right Pain - part of body: Hip     Time: 1035-1106 PT Time Calculation (min) (ACUTE ONLY): 31 min  Charges:  $Gait Training: 8-22 mins $Therapeutic Exercise: 8-22 mins                     Dyneshia Baccam H. Owens Shark, PT, DPT, NCS 01/10/19, 11:17 AM [phone]

## 2020-03-17 ENCOUNTER — Emergency Department: Payer: Medicare Other

## 2020-03-17 ENCOUNTER — Other Ambulatory Visit: Payer: Self-pay

## 2020-03-17 ENCOUNTER — Emergency Department
Admission: EM | Admit: 2020-03-17 | Discharge: 2020-03-17 | Disposition: A | Discharge status: Other Acute Inpatient Hospital | Payer: Medicare Other | Attending: Emergency Medicine | Admitting: Emergency Medicine

## 2020-03-17 DIAGNOSIS — Y92009 Unspecified place in unspecified non-institutional (private) residence as the place of occurrence of the external cause: Secondary | ICD-10-CM | POA: Diagnosis not present

## 2020-03-17 DIAGNOSIS — Y9389 Activity, other specified: Secondary | ICD-10-CM | POA: Insufficient documentation

## 2020-03-17 DIAGNOSIS — S41031A Puncture wound without foreign body of right shoulder, initial encounter: Secondary | ICD-10-CM | POA: Insufficient documentation

## 2020-03-17 DIAGNOSIS — W19XXXA Unspecified fall, initial encounter: Secondary | ICD-10-CM | POA: Diagnosis not present

## 2020-03-17 DIAGNOSIS — Z7982 Long term (current) use of aspirin: Secondary | ICD-10-CM | POA: Diagnosis not present

## 2020-03-17 DIAGNOSIS — F17210 Nicotine dependence, cigarettes, uncomplicated: Secondary | ICD-10-CM | POA: Diagnosis not present

## 2020-03-17 DIAGNOSIS — S42291B Other displaced fracture of upper end of right humerus, initial encounter for open fracture: Secondary | ICD-10-CM

## 2020-03-17 DIAGNOSIS — M25551 Pain in right hip: Secondary | ICD-10-CM | POA: Insufficient documentation

## 2020-03-17 DIAGNOSIS — Z7901 Long term (current) use of anticoagulants: Secondary | ICD-10-CM | POA: Diagnosis not present

## 2020-03-17 DIAGNOSIS — Z859 Personal history of malignant neoplasm, unspecified: Secondary | ICD-10-CM | POA: Diagnosis not present

## 2020-03-17 DIAGNOSIS — S0181XA Laceration without foreign body of other part of head, initial encounter: Secondary | ICD-10-CM | POA: Diagnosis not present

## 2020-03-17 DIAGNOSIS — S0191XA Laceration without foreign body of unspecified part of head, initial encounter: Secondary | ICD-10-CM | POA: Diagnosis present

## 2020-03-17 DIAGNOSIS — I609 Nontraumatic subarachnoid hemorrhage, unspecified: Secondary | ICD-10-CM

## 2020-03-17 DIAGNOSIS — Z23 Encounter for immunization: Secondary | ICD-10-CM | POA: Diagnosis not present

## 2020-03-17 DIAGNOSIS — Z79899 Other long term (current) drug therapy: Secondary | ICD-10-CM | POA: Insufficient documentation

## 2020-03-17 DIAGNOSIS — Y999 Unspecified external cause status: Secondary | ICD-10-CM | POA: Diagnosis not present

## 2020-03-17 DIAGNOSIS — T1490XA Injury, unspecified, initial encounter: Secondary | ICD-10-CM

## 2020-03-17 DIAGNOSIS — S32591A Other specified fracture of right pubis, initial encounter for closed fracture: Secondary | ICD-10-CM

## 2020-03-17 DIAGNOSIS — R42 Dizziness and giddiness: Secondary | ICD-10-CM | POA: Diagnosis not present

## 2020-03-17 DIAGNOSIS — R578 Other shock: Secondary | ICD-10-CM

## 2020-03-17 DIAGNOSIS — J449 Chronic obstructive pulmonary disease, unspecified: Secondary | ICD-10-CM | POA: Diagnosis not present

## 2020-03-17 LAB — CBC WITH DIFFERENTIAL/PLATELET
Abs Immature Granulocytes: 0.25 10*3/uL — ABNORMAL HIGH (ref 0.00–0.07)
Basophils Absolute: 0.1 10*3/uL (ref 0.0–0.1)
Basophils Relative: 1 %
Eosinophils Absolute: 0.1 10*3/uL (ref 0.0–0.5)
Eosinophils Relative: 1 %
HCT: 34.5 % — ABNORMAL LOW (ref 39.0–52.0)
Hemoglobin: 11.9 g/dL — ABNORMAL LOW (ref 13.0–17.0)
Immature Granulocytes: 2 %
Lymphocytes Relative: 32 %
Lymphs Abs: 4.3 10*3/uL — ABNORMAL HIGH (ref 0.7–4.0)
MCH: 36.8 pg — ABNORMAL HIGH (ref 26.0–34.0)
MCHC: 34.5 g/dL (ref 30.0–36.0)
MCV: 106.8 fL — ABNORMAL HIGH (ref 80.0–100.0)
Monocytes Absolute: 1.2 10*3/uL — ABNORMAL HIGH (ref 0.1–1.0)
Monocytes Relative: 9 %
Neutro Abs: 7.3 10*3/uL (ref 1.7–7.7)
Neutrophils Relative %: 55 %
Platelets: 173 10*3/uL (ref 150–400)
RBC: 3.23 MIL/uL — ABNORMAL LOW (ref 4.22–5.81)
RDW: 12.3 % (ref 11.5–15.5)
WBC: 13.3 10*3/uL — ABNORMAL HIGH (ref 4.0–10.5)
nRBC: 0 % (ref 0.0–0.2)

## 2020-03-17 LAB — COMPREHENSIVE METABOLIC PANEL
ALT: 26 U/L (ref 0–44)
AST: 32 U/L (ref 15–41)
Albumin: 4 g/dL (ref 3.5–5.0)
Alkaline Phosphatase: 54 U/L (ref 38–126)
Anion gap: 9 (ref 5–15)
BUN: 10 mg/dL (ref 8–23)
CO2: 30 mmol/L (ref 22–32)
Calcium: 8.8 mg/dL — ABNORMAL LOW (ref 8.9–10.3)
Chloride: 91 mmol/L — ABNORMAL LOW (ref 98–111)
Creatinine, Ser: 0.61 mg/dL (ref 0.61–1.24)
GFR calc Af Amer: >60 mL/min (ref >60)
GFR calc non Af Amer: >60 mL/min (ref >60)
Glucose, Bld: 89 mg/dL (ref 70–99)
Potassium: 4.6 mmol/L (ref 3.5–5.1)
Sodium: 130 mmol/L — ABNORMAL LOW (ref 135–145)
Total Bilirubin: 0.9 mg/dL (ref 0.3–1.2)
Total Protein: 6.3 g/dL — ABNORMAL LOW (ref 6.5–8.1)

## 2020-03-17 LAB — BRAIN NATRIURETIC PEPTIDE: B Natriuretic Peptide: 141 pg/mL — ABNORMAL HIGH (ref 0.0–100.0)

## 2020-03-17 LAB — HEMOGLOBIN AND HEMATOCRIT, BLOOD
HCT: 28.9 % — ABNORMAL LOW (ref 39.0–52.0)
Hemoglobin: 9.8 g/dL — ABNORMAL LOW (ref 13.0–17.0)

## 2020-03-17 LAB — TROPONIN I (HIGH SENSITIVITY)
Troponin I (High Sensitivity): 23 ng/L — ABNORMAL HIGH (ref <18)
Troponin I (High Sensitivity): 90 ng/L — ABNORMAL HIGH (ref <18)

## 2020-03-17 MED ORDER — IOHEXOL 300 MG/ML  SOLN
100.0000 mL | Freq: Once | INTRAMUSCULAR | Status: AC | PRN
  Administered 2020-03-17: 100 mL via INTRAVENOUS

## 2020-03-17 MED ORDER — OXYCODONE HCL 5 MG PO TABS
5.0000 mg | ORAL_TABLET | Freq: Once | ORAL | Status: AC
  Administered 2020-03-17: 5 mg via ORAL
  Filled 2020-03-17: qty 1

## 2020-03-17 MED ORDER — FENTANYL CITRATE (PF) 100 MCG/2ML IJ SOLN
25.0000 ug | Freq: Once | INTRAMUSCULAR | Status: AC
  Administered 2020-03-17: 25 ug via INTRAVENOUS

## 2020-03-17 MED ORDER — IPRATROPIUM BROMIDE 0.02 % IN SOLN
0.5000 mg | Freq: Once | RESPIRATORY_TRACT | Status: AC
  Administered 2020-03-17: 0.5 mg via RESPIRATORY_TRACT
  Filled 2020-03-17: qty 2.5

## 2020-03-17 MED ORDER — MORPHINE SULFATE (PF) 4 MG/ML IV SOLN
4.0000 mg | Freq: Once | INTRAVENOUS | Status: AC
  Administered 2020-03-17: 4 mg via INTRAVENOUS
  Filled 2020-03-17: qty 1

## 2020-03-17 MED ORDER — ONDANSETRON HCL 4 MG/2ML IJ SOLN
4.0000 mg | Freq: Once | INTRAMUSCULAR | Status: AC
  Administered 2020-03-17: 4 mg via INTRAVENOUS
  Filled 2020-03-17: qty 2

## 2020-03-17 MED ORDER — SODIUM CHLORIDE 0.9% IV SOLUTION
Freq: Once | INTRAVENOUS | Status: DC
  Filled 2020-03-17: qty 250

## 2020-03-17 MED ORDER — LACTATED RINGERS IV BOLUS
1000.0000 mL | Freq: Once | INTRAVENOUS | Status: AC
  Administered 2020-03-17: 1000 mL via INTRAVENOUS

## 2020-03-17 MED ORDER — FENTANYL CITRATE (PF) 100 MCG/2ML IJ SOLN
INTRAMUSCULAR | Status: AC
  Filled 2020-03-17: qty 2

## 2020-03-17 MED ORDER — CEFAZOLIN SODIUM-DEXTROSE 1-4 GM/50ML-% IV SOLN
1.0000 g | Freq: Once | INTRAVENOUS | Status: AC
  Administered 2020-03-17: 1 g via INTRAVENOUS
  Filled 2020-03-17: qty 50

## 2020-03-17 MED ORDER — ACETAMINOPHEN 500 MG PO TABS
1000.0000 mg | ORAL_TABLET | Freq: Once | ORAL | Status: AC
  Administered 2020-03-17: 1000 mg via ORAL
  Filled 2020-03-17: qty 2

## 2020-03-17 MED ORDER — SODIUM CHLORIDE 0.9 % IV BOLUS
500.0000 mL | Freq: Once | INTRAVENOUS | Status: AC
  Administered 2020-03-17: 500 mL via INTRAVENOUS

## 2020-03-17 MED ORDER — TETANUS-DIPHTH-ACELL PERTUSSIS 5-2.5-18.5 LF-MCG/0.5 IM SUSP
0.5000 mL | Freq: Once | INTRAMUSCULAR | Status: AC
  Administered 2020-03-17: 0.5 mL via INTRAMUSCULAR
  Filled 2020-03-17: qty 0.5

## 2020-03-17 MED ORDER — ALBUTEROL SULFATE (2.5 MG/3ML) 0.083% IN NEBU
10.0000 mg | INHALATION_SOLUTION | Freq: Once | RESPIRATORY_TRACT | Status: AC
  Administered 2020-03-17: 10 mg via RESPIRATORY_TRACT
  Filled 2020-03-17: qty 12

## 2020-03-17 NOTE — ED Notes (Signed)
[place] collar applied for transfer

## 2020-03-17 NOTE — ED Notes (Signed)
Pt alert and oriented at this time. reprots occasionally wears O2 at home when needed, hx COPD.  Currently hypoxic and hypotensive, dr Tamala Julian at bedside.  Radial pulses bilateral.  DP pulses palpable bilateral.  PERRL but pupils sluggish.  Unlabored.

## 2020-03-17 NOTE — ED Notes (Signed)
EMTALA reviewed. 

## 2020-03-17 NOTE — ED Notes (Signed)
Pt has swelling to right upper arm with bruising.  Bruising and swelling right periorbital.  Pain to right hip.  No visible bruising or swelling to pelvis.  BP cuff moved to ankle, remains hypotensive. Fluids going in.  Dr Tamala Julian notified and at bedside with [place]

## 2020-03-17 NOTE — ED Notes (Signed)
Report received 

## 2020-03-17 NOTE — ED Notes (Signed)
This RN spoke with patients daughter at this time. She would like an update when possible Laura([place]) [phone]

## 2020-03-17 NOTE — ED Provider Notes (Signed)
Patient received in signout from Dr. Beather Arbour pending imaging after mechanical fall onto his right side.  Initial imaging reviewed with evidence of small SAH, pubic ramus fracture and proximal humerus fracture.  Discussed case with neurosurgery, who recommends repeat CT in 6 hours and holding of aspirin.  Due to patient's age, comorbidities, multiple organ systems involved with his traumatic pathology, I contacted [facility] for transfer to their trauma service.  They are agreeable to ED-ED transfer.  During coordination of transfer, patient noted to decompensate hemodynamically with hypotensive pressures.  While he was tachycardic on presentation, he never generated a pulse greater than about 90 with his hypotension.  Bedside FAST exam is negative for intra-abdominal bleeding.  CT chest abdomen pelvis demonstrating retro and extraperitoneal bleeding without active extravasation, and repeat H/H demonstrating two-point hemoglobin drop.  I am concerned about acute hemorrhagic shock, and 1 unit of emergent release blood ordered as we are waiting for his type and screen.  [facility] was updated on clinical deterioration.  [facility] helicopter transfer was arranged.  [facility] trauma surgeon requesting additional unit of PRBCs as well as 1 unit of FFP.  This was ordered and hanging at the time of transfer, for a total of 2 units PRBCs and 1 unit FFP.  With blood product administration, patient's hemodynamics improved with maps in the 70s and 80s.  Patient reporting severe pain to his right sided hip, likely due to distention from bleeding and his pubic ramus fracture.  We provided him with small aliquots of fentanyl.  Patient stable at the time of [facility] arrival with blood products ongoing.    Clinical Course as of Mar 18 1007  Mon Mar 17, 2020  0659 Patient tolerated suture repair well.  Care transferred to Dr. Tamala Julian at change of shift pending lab work and imaging studies.   [JS]  65 Spoke  with Dr. Lacinda Axon, NSGY on call, and discussed case. He recommends repeat head CT in 6 hours and holding asa   [DS]  0800 Spoke with [facility].    [DS]  0801 Not an autoaccept, but they will contact trauma doc for decision   [DS]  [phone] Patient accepted for ED to ED transfer. During nursing handoff of this, our nurse called me to the room due to concerns for hypoxia and hypotension.  Patient on nasal cannula with sats in the mid 80s and BP with maps in the upper 50s.  Clinically, patient looks well and similar to my evaluation about 30 minutes ago.  Well-perfused, conversational in full sentences, requesting water.  Auscultation reveals diffuse expiratory wheezes, prolonged expiratory phase and poor air movement throughout.  We will bolus a liter of LR and provide albuterol to assist with his oxygenation.   [DS]  0630 Patient in [place]   [DS]  0848 Call back from [facility] transfer center requesting an update.  Educated them that patient's BP is slowly improving with LR infusion, that he is over the CT scanner now, and that they should call back in another 10-15 minutes after patient returns from CT and I reevaluate him   [DS]  0906 Callback from [place], patient has extraperitoneal bleeding anterior to the bladder as well as retroperitoneal bleeding.   [DS]  1601 Reassessed the patient.  Maps improving with LR bolus.  Maps in the 65s now.  Educated patient on bleeding around his abdomen and recommendation for blood transfusion.  We discussed risk and benefits.  He is agreeable.  Ordered 1 unit  of emergent blood release. Asked secretary to call back [facility] transfer center for an update   [DS]  0914 H&H of two-point drop, repeated with blood drawn for type and screen   [DS]  [phone] Nurses setting up blood transfusion.  I just got off the phone with Shanon Brow from [place] to arrange helicopter transport. Neurosurgical NP at the bedside assessing the patient.Heart rate 90, map of 77   [DS]  0932 Callback  from [place], helicopters on the way, will be here in 30-35 minutes.   [DS]  6394 [id number] from [place] transfer center.  Requesting power share of CT abdomen/pelvis.  Requesting additional unit of blood and FFP.   [DS]    Clinical Course User Index [DS] Vladimir Crofts, MD [JS] Paulette Blanch, MD   .Critical Care Performed by: Vladimir Crofts, MD Authorized by: Vladimir Crofts, MD   Critical care provider statement:    Critical care time (minutes):  100   Critical care was necessary to treat or prevent imminent or life-threatening deterioration of the following conditions:  Trauma, circulatory failure and shock   Critical care was time spent personally by me on the following activities:  Discussions with consultants, evaluation of patient's response to treatment, examination of patient, ordering and performing treatments and interventions, ordering and review of laboratory studies, ordering and review of radiographic studies, pulse oximetry, re-evaluation of patient's condition, obtaining history from patient or surrogate and review of old charts      Vladimir Crofts, MD 03/17/20 1015

## 2020-03-17 NOTE — ED Notes (Signed)
Dr Tamala Julian called and updated next of kin of pt status

## 2020-03-17 NOTE — ED Notes (Signed)
Pt to CT with RN and monitor.

## 2020-03-17 NOTE — Consult Note (Signed)
Referring Physician:  No referring provider defined for this encounter.  Primary Physician:  Dorita Fray, MD  Chief Complaint:  Fall w/ resultant SAH  History of Present Illness: Darren Coleman is a 69 y.o. male who presents with the chief complaint of a fall with head strike. Per ED notes, and verified by myself with the patient, Darren Coleman was "brought to the ED via EMS from home status post fall.  Patient states he woke up, stood up, got a little dizzy and fell, striking his right head, shoulder and hip.  Presents with laceration to right head, shoulder deformity and right hip pain.  Unknown date of last tetanus shot.  Takes a baby aspirin daily.  Denies vision changes, neck pain, chest pain, shortness of breath, abdominal pain, nausea or vomiting.  Loose cough noted which patient states this is baseline."  A CT of head does show evidence of a SAH. He takes ASA 81mg  daily. Per chart notes it says he takes Eliquis however he denies taking this medication.  C Spine CT negative for fracture.   NSU consulted due to presence of SAH,   Review of Systems:  A 10 point review of systems is negative, except for the pertinent positives and negatives detailed in the HPI.  Past Medical History: Past Medical History:  Diagnosis Date  . Cancer ([place])   . COPD (chronic obstructive pulmonary disease) ([place])     Past Surgical History: Past Surgical History:  Procedure Laterality Date  . COLON SURGERY    . INTRAMEDULLARY (IM) NAIL INTERTROCHANTERIC Right 01/03/2019   Procedure: INTRAMEDULLARY (IM) NAIL INTERTROCHANTRIC;  Surgeon: Corky Mull, MD;  Location: [facility] ORS;  Service: Orthopedics;  Laterality: Right;    Allergies: Allergies as of 03/17/2020 - Review Complete 03/17/2020  Allergen Reaction Noted  . Sulfa antibiotics  01/02/2019    Medications:  Current Facility-Administered Medications:  .  fentaNYL (SUBLIMAZE) 100 MCG/2ML injection, , , ,   Current Outpatient  Medications:  .  acetaminophen (TYLENOL) 500 MG tablet, Take 500 mg by mouth every 6 (six) hours as needed for pain., Disp: , Rfl:  .  albuterol (VENTOLIN HFA) 108 (90 Base) MCG/ACT inhaler, Inhale 2 puffs into the lungs every 6 (six) hours as needed for wheezing., Disp: , Rfl:  .  apixaban (ELIQUIS) 5 MG TABS tablet, Take 1 tablet (5 mg total) by mouth 2 (two) times daily., Disp: 60 tablet, Rfl: 0 .  aspirin EC 81 MG tablet, Take 81 mg by mouth daily., Disp: , Rfl:  .  atorvastatin (LIPITOR) 40 MG tablet, Take 40 mg by mouth daily., Disp: , Rfl:  .  ferrous sulfate 325 (65 FE) MG tablet, Take 1 tablet (325 mg total) by mouth 2 (two) times daily with a meal., Disp: 60 tablet, Rfl: 0 .  Fluticasone-Umeclidin-Vilant 100-62.5-25 MCG/INH AEPB, Inhale 1 puff into the lungs daily., Disp: , Rfl:  .  furosemide (LASIX) 40 MG tablet, Take 40 mg by mouth daily., Disp: , Rfl:  .  methocarbamol (ROBAXIN) 500 MG tablet, Take 500 mg by mouth 4 (four) times daily., Disp: , Rfl:  .  metoprolol succinate (TOPROL-XL) 50 MG 24 hr tablet, Take 1 tablet (50 mg total) by mouth daily. Take with or immediately following a meal., Disp: 30 tablet, Rfl: 0 .  oxyCODONE (OXY IR/ROXICODONE) 5 MG immediate release tablet, Take 1-2 tablets (5-10 mg total) by mouth every 4 (four) hours as needed for moderate pain (pain score 4-6)., Disp: 30 tablet, Rfl:  0 .  predniSONE (DELTASONE) 10 MG tablet, Take 10 mg by mouth daily., Disp: , Rfl:  .  Saline (AYR SALINE NASAL DROPS) 0.65 % (Soln) SOLN, Place 2 drops into both nostrils every 4 (four) hours., Disp: , Rfl:  .  tadalafil (CIALIS) 5 MG tablet, Take 5 mg by mouth as needed., Disp: , Rfl:  .  tiZANidine (ZANAFLEX) 4 MG tablet, Take 4 mg by mouth every 6 (six) hours as needed for muscle spasms., Disp: , Rfl:  .  traMADol (ULTRAM) 50 MG tablet, Take 1 tablet (50 mg total) by mouth every 6 (six) hours., Disp: 30 tablet, Rfl: 1   Social History: Social History   Tobacco Use  .  Smoking status: Current Every Day Smoker    Packs/day: 0.50    Types: Cigarettes  . Smokeless tobacco: Never Used  Substance Use Topics  . Alcohol use: Yes    Comment: sporadic  . Drug use: Never    Family Medical History: No family history on file.  Physical Examination: Vitals:   03/17/20 0819 03/17/20 0923  BP:  (!) 83/70  Pulse:  82  Resp:  18  Temp:  97.7 F (36.5 C)  SpO2: 95% 93%     General: Patient is well developed, well nourished, calm, collected, and in no apparent distress with exception of pain.   Psychiatric: Patient is non-anxious.  Head:  Pupils equal, round, and reactive to light.  ENT:  Oral mucosa appears dry  Skin:   Multiple areas of R periorbital ecchymosis ecchymosis as well as R shoulder abrasion and bruising.   NEUROLOGICAL:  General: In no acute distress.   Awake, alert, oriented to person, place, and time. Able to name 3/3 objects appropriately. Follows commands.  Pupils equal round and reactive to light. EOMI, peripheral fields intact.  Facial tone is symmetric.  Tongue protrusion is midline.  There is no drift noted to left arm (R arm wrapped in sling and with severe pain with movement). Moving lower extremities equally and with full strength although limited due to pain.  Unable to move R arm, assumed this is due to RUE injury, although he has full grip strength in R hand.    ROM of spine: deferred     Bilateral upper and lower extremity sensation is intact to light touch.   Imaging: CT Head 03/17/2020: IMPRESSION: 1. Tiny focus of subarachnoid hemorrhage identified anterior right frontal lobe. Although not definite, there may be trace subdural blood along the anterior falx cranially. 2. Atrophy with chronic small vessel white matter ischemic disease. 3. Minimally displaced age-indeterminate nasal bone fractures, potentially chronic. 4. Soft tissue swelling over the right orbital region. 5. No cervical spine fracture. Loss of  cervical lordosis. This can be related to patient positioning, muscle spasm or soft tissue injury.   Assessment and Plan: Darren Coleman is a pleasant 69 y.o. male with evidence of small SAH. He is neurologically intact. He is being transferred to [facility] due to multiple injuries.   - Repeat head CT in 6 hours from original, approx 1pm to evaluate for increase of SAH given that he is on ASA, and records indicate Eliquis prescription as well.  - Hold ASA and Eliquis at this time.  - Medical management per medicine/ED team  This has been discussed with Dr Lacinda Axon.    Lonell Face, NP Dept. of Neurosurgery [facility]

## 2020-03-17 NOTE — ED Notes (Signed)
Pharmacy notified for need of ancef

## 2020-03-17 NOTE — ED Provider Notes (Signed)
[facility] Emergency Department Provider Note   ____________________________________________   First MD Initiated Contact with Patient 03/17/20 [phone]     (approximate)  I have reviewed the triage vital signs and the nursing notes.   HISTORY  Chief Complaint Fall   HPI Darren Coleman is a 69 y.o. male brought to the ED via EMS from home status post fall.  Patient states he woke up, stood up, got a little dizzy and fell, striking his right head, shoulder and hip.  Presents with laceration to right head, shoulder deformity and right hip pain.  Unknown date of last tetanus shot.  Takes a baby aspirin daily.  Denies vision changes, neck pain, chest pain, shortness of breath, abdominal pain, nausea or vomiting.  Loose cough noted which patient states this is baseline.      Past Medical History:  Diagnosis Date  . Cancer ([place])   . COPD (chronic obstructive pulmonary disease) [facility])     Patient Active Problem List   Diagnosis Date Noted  . Pressure injury of skin 01/09/2019  . Closed right hip fracture ([place]) 01/03/2019  . COPD (chronic obstructive pulmonary disease) ([place]) 01/03/2019    Past Surgical History:  Procedure Laterality Date  . COLON SURGERY    . INTRAMEDULLARY (IM) NAIL INTERTROCHANTERIC Right 01/03/2019   Procedure: INTRAMEDULLARY (IM) NAIL INTERTROCHANTRIC;  Surgeon: Corky Mull, MD;  Location: [facility] ORS;  Service: Orthopedics;  Laterality: Right;    Prior to Admission medications   Medication Sig Start Date End Date Taking? Authorizing Provider  acetaminophen (TYLENOL) 500 MG tablet Take 500 mg by mouth every 6 (six) hours as needed for pain.    [provider]  albuterol (VENTOLIN HFA) 108 (90 Base) MCG/ACT inhaler Inhale 2 puffs into the lungs every 6 (six) hours as needed for wheezing. 01/08/15   [provider]  apixaban (ELIQUIS) 5 MG TABS tablet Take 1 tablet (5 mg total) by mouth 2 (two) times daily. 01/10/19    Epifanio Lesches, MD  aspirin EC 81 MG tablet Take 81 mg by mouth daily.    [provider]  atorvastatin (LIPITOR) 40 MG tablet Take 40 mg by mouth daily. 08/11/18   [provider]  ferrous sulfate 325 (65 FE) MG tablet Take 1 tablet (325 mg total) by mouth 2 (two) times daily with a meal. 01/10/19   Epifanio Lesches, MD  Fluticasone-Umeclidin-Vilant 100-62.5-25 MCG/INH AEPB Inhale 1 puff into the lungs daily. 10/19/18   [provider]  furosemide (LASIX) 40 MG tablet Take 40 mg by mouth daily. 03/13/18   [provider]  methocarbamol (ROBAXIN) 500 MG tablet Take 500 mg by mouth 4 (four) times daily. 11/02/18   [provider]  metoprolol succinate (TOPROL-XL) 50 MG 24 hr tablet Take 1 tablet (50 mg total) by mouth daily. Take with or immediately following a meal. 01/11/19   Epifanio Lesches, MD  oxyCODONE (OXY IR/ROXICODONE) 5 MG immediate release tablet Take 1-2 tablets (5-10 mg total) by mouth every 4 (four) hours as needed for moderate pain (pain score 4-6). 01/06/19   Reche Dixon, PA-C  predniSONE (DELTASONE) 10 MG tablet Take 10 mg by mouth daily. 11/23/18   [provider]  Saline (AYR SALINE NASAL DROPS) 0.65 % (Soln) SOLN Place 2 drops into both nostrils every 4 (four) hours. 01/24/18   [provider]  tadalafil (CIALIS) 5 MG tablet Take 5 mg by mouth as needed. 09/01/18   [provider]  tiZANidine (  ZANAFLEX) 4 MG tablet Take 4 mg by mouth every 6 (six) hours as needed for muscle spasms. 12/29/18   [provider]  traMADol (ULTRAM) 50 MG tablet Take 1 tablet (50 mg total) by mouth every 6 (six) hours. 01/06/19   Reche Dixon, PA-C    Allergies Sulfa antibiotics  No family history on file.  Social History Social History   Tobacco Use  . Smoking status: Current Every Day Smoker    Packs/day: 0.50    Types: Cigarettes  . Smokeless tobacco: Never Used  Substance Use Topics  . Alcohol use: Yes     Comment: sporadic  . Drug use: Never    Review of Systems  Constitutional: No fever/chills Eyes: No visual changes. ENT: Positive for right head/facial laceration.  No sore throat. Cardiovascular: Denies chest pain. Respiratory: Denies shortness of breath. Gastrointestinal: No abdominal pain.  No nausea, no vomiting.  No diarrhea.  No constipation. Genitourinary: Negative for dysuria. Musculoskeletal: Positive for right shoulder and hip pain.  Negative for back pain. Skin: Negative for rash. Neurological: Positive for dizziness.  Negative for headaches, focal weakness or numbness.   ____________________________________________   PHYSICAL EXAM:  VITAL SIGNS: ED Triage Vitals  Enc Vitals Group     BP      Pulse      Resp      Temp      Temp src      SpO2      Weight      Height      Head Circumference      Peak Flow      Pain Score      Pain Loc      Pain Edu?      Excl. in [place]?     Constitutional: Alert and oriented.  Elderly appearing and in no acute distress. Eyes: Conjunctivae are normal. PERRL. EOMI. Head: 2 minor lacerations adjacent to right eyebrow with mild venous oozing. Nose: Atraumatic. Mouth/Throat: Mucous membranes are moist.  No dental malocclusion.  Neck: No stridor.  No cervical spine tenderness to palpation. Cardiovascular: Normal rate, regular rhythm. Grossly normal heart sounds.  Good peripheral circulation. Respiratory: Normal respiratory effort.  No retractions. Lungs CTAB. Gastrointestinal: Soft and nontender to D palpation. No distention. No abdominal bruits. No CVA tenderness. Musculoskeletal:  RUE: In sling per EMS.  Shoulder deformity with puncture wound.  Limited range of motion secondary to pain.  2+ radial pulse.  Brisk, less than 5-second capillary refill. No spinal tenderness to palpation.  Pelvis is stable.  Right hip tender to palpation but with full range of motion. Neurologic:  Normal speech and language. No gross focal neurologic  deficits are appreciated.  Skin:  Skin is warm, dry and intact. No rash noted. Psychiatric: Mood and affect are normal. Speech and behavior are normal.  ____________________________________________   LABS (all labs ordered are listed, but only abnormal results are displayed)  Labs Reviewed  CBC WITH DIFFERENTIAL/PLATELET - Abnormal; Notable for the following components:      Result Value   WBC 13.3 (*)    RBC 3.23 (*)    Hemoglobin 11.9 (*)    HCT 34.5 (*)    MCV 106.8 (*)    MCH 36.8 (*)    Lymphs Abs 4.3 (*)    Monocytes Absolute 1.2 (*)    Abs Immature Granulocytes 0.25 (*)    All other components within normal limits  COMPREHENSIVE METABOLIC PANEL - Abnormal; Notable for the following components:  Sodium 130 (*)    Chloride 91 (*)    Calcium 8.8 (*)    Total Protein 6.3 (*)    All other components within normal limits  BRAIN NATRIURETIC PEPTIDE - Abnormal; Notable for the following components:   B Natriuretic Peptide 141.0 (*)    All other components within normal limits  TROPONIN I (HIGH SENSITIVITY) - Abnormal; Notable for the following components:   Troponin I (High Sensitivity) 23 (*)    All other components within normal limits  TROPONIN I (HIGH SENSITIVITY)   ____________________________________________  EKG  ED ECG REPORT I, Ioanna Colquhoun J, the attending physician, personally viewed and interpreted this ECG.   Date: 03/17/2020  EKG Time: 0616  Rate: 104  Rhythm: sinus tachycardia  Axis: Normal  Intervals:none  ST&T Change: Nonspecific  ____________________________________________  RADIOLOGY  ED MD interpretation: Pending  Official radiology report(s): DG Shoulder Right  Result Date: 03/17/2020 CLINICAL DATA:  Fall with shoulder pain EXAM: RIGHT SHOULDER - 2+ VIEW COMPARISON:  None. FINDINGS: Transverse femoral neck fracture with impaction. On the scapular Y-view the greater tuberosity appears to be a separate fragment with up to 8 mm of  displacement. Prominent osteopenia.  No dislocation or shoulder separation. IMPRESSION: Surgical neck and greater tuberosity fracture of the right humerus. Electronically Signed   By: Monte Fantasia M.D.   On: 03/17/2020 07:36   CT Head Wo Contrast  Result Date: 03/17/2020 CLINICAL DATA:  Fall.  Right facial laceration. EXAM: CT HEAD WITHOUT CONTRAST CT MAXILLOFACIAL WITHOUT CONTRAST CT CERVICAL SPINE WITHOUT CONTRAST TECHNIQUE: Multidetector CT imaging of the head, cervical spine, and maxillofacial structures were performed using the standard protocol without intravenous contrast. Multiplanar CT image reconstructions of the cervical spine and maxillofacial structures were also generated. COMPARISON:  Head CT 08/26/2008 FINDINGS: CT HEAD FINDINGS Brain: Tiny focus of subarachnoid hemorrhage identified anterior right frontal lobe (axial 23/3 and coronal image 9/series 4). Although not definite, there may be trace subdural blood along the anterior falx cranially. Diffuse loss of parenchymal volume is consistent with atrophy. Patchy low attenuation in the deep hemispheric and periventricular white matter is nonspecific, but likely reflects chronic microvascular ischemic demyelination. Vascular: No hyperdense vessel or unexpected calcification. Skull: No evidence for fracture. No worrisome lytic or sclerotic lesion. Other: None. CT MAXILLOFACIAL FINDINGS Osseous: Minimally displaced age-indeterminate nasal bone fractures, potentially chronic. Otherwise no evidence for an acute fracture in the bony anatomy of the face. Temporomandibular joints are located bilaterally. Orbits: Negative. No traumatic or inflammatory finding. Sinuses: Trace mucosal disease noted in both maxillary sinuses with tiny left maxillary air-fluid level. The remaining paranasal sinuses and mastoid air cells are clear. Soft tissues: Soft tissue swelling noted over the right orbital region. CT CERVICAL SPINE FINDINGS Alignment: Mild accentuation  cervical lordosis without subluxation. Skull base and vertebrae: No acute fracture. No primary bone lesion or focal pathologic process. Soft tissues and spinal canal: No prevertebral fluid or swelling. No visible canal hematoma. Disc levels: Mild loss of disc height noted C2-3 and C4-5. Facet degeneration noted upper cervical levels bilaterally. Upper chest: Emphysema noted in the lung apices. Other: None. IMPRESSION: 1. Tiny focus of subarachnoid hemorrhage identified anterior right frontal lobe. Although not definite, there may be trace subdural blood along the anterior falx cranially. 2. Atrophy with chronic small vessel white matter ischemic disease. 3. Minimally displaced age-indeterminate nasal bone fractures, potentially chronic. 4. Soft tissue swelling over the right orbital region. 5. No cervical spine fracture. Loss of cervical lordosis. This can be  related to patient positioning, muscle spasm or soft tissue injury. Critical Value/emergent results were called by me at the time of interpretation on 03/17/2020 at 7:25 am to provider Churchill Grimsley , who verbally acknowledged these results. Electronically Signed   By: Misty Stanley M.D.   On: 03/17/2020 07:25   CT Cervical Spine Wo Contrast  Result Date: 03/17/2020 CLINICAL DATA:  Fall.  Right facial laceration. EXAM: CT HEAD WITHOUT CONTRAST CT MAXILLOFACIAL WITHOUT CONTRAST CT CERVICAL SPINE WITHOUT CONTRAST TECHNIQUE: Multidetector CT imaging of the head, cervical spine, and maxillofacial structures were performed using the standard protocol without intravenous contrast. Multiplanar CT image reconstructions of the cervical spine and maxillofacial structures were also generated. COMPARISON:  Head CT 08/26/2008 FINDINGS: CT HEAD FINDINGS Brain: Tiny focus of subarachnoid hemorrhage identified anterior right frontal lobe (axial 23/3 and coronal image 9/series 4). Although not definite, there may be trace subdural blood along the anterior falx cranially. Diffuse  loss of parenchymal volume is consistent with atrophy. Patchy low attenuation in the deep hemispheric and periventricular white matter is nonspecific, but likely reflects chronic microvascular ischemic demyelination. Vascular: No hyperdense vessel or unexpected calcification. Skull: No evidence for fracture. No worrisome lytic or sclerotic lesion. Other: None. CT MAXILLOFACIAL FINDINGS Osseous: Minimally displaced age-indeterminate nasal bone fractures, potentially chronic. Otherwise no evidence for an acute fracture in the bony anatomy of the face. Temporomandibular joints are located bilaterally. Orbits: Negative. No traumatic or inflammatory finding. Sinuses: Trace mucosal disease noted in both maxillary sinuses with tiny left maxillary air-fluid level. The remaining paranasal sinuses and mastoid air cells are clear. Soft tissues: Soft tissue swelling noted over the right orbital region. CT CERVICAL SPINE FINDINGS Alignment: Mild accentuation cervical lordosis without subluxation. Skull base and vertebrae: No acute fracture. No primary bone lesion or focal pathologic process. Soft tissues and spinal canal: No prevertebral fluid or swelling. No visible canal hematoma. Disc levels: Mild loss of disc height noted C2-3 and C4-5. Facet degeneration noted upper cervical levels bilaterally. Upper chest: Emphysema noted in the lung apices. Other: None. IMPRESSION: 1. Tiny focus of subarachnoid hemorrhage identified anterior right frontal lobe. Although not definite, there may be trace subdural blood along the anterior falx cranially. 2. Atrophy with chronic small vessel white matter ischemic disease. 3. Minimally displaced age-indeterminate nasal bone fractures, potentially chronic. 4. Soft tissue swelling over the right orbital region. 5. No cervical spine fracture. Loss of cervical lordosis. This can be related to patient positioning, muscle spasm or soft tissue injury. Critical Value/emergent results were called by me  at the time of interpretation on 03/17/2020 at 7:25 am to provider Brigido Mera , who verbally acknowledged these results. Electronically Signed   By: Misty Stanley M.D.   On: 03/17/2020 07:25   DG Hip Unilat With Pelvis 2-3 Views Right  Result Date: 03/17/2020 CLINICAL DATA:  Fall with right shoulder pain. EXAM: DG HIP (WITH OR WITHOUT PELVIS) 2-3V RIGHT COMPARISON:  01/02/2019 FINDINGS: Marked osteopenia. Mildly displaced right inferior pubic ramus fracture which was not seen in 2020. There may also be a pubic body fracture given the symphysis pubis does not appear is congruent as before. Right femoral nail fixation. No hardware complication where covered. IMPRESSION: 1. Right inferior pubic ramus fracture that is new from 2020. 2. Osteopenia to the degree that radiographic sensitivity is significantly diminished. Electronically Signed   By: Monte Fantasia M.D.   On: 03/17/2020 07:38   CT Maxillofacial WO CM  Result Date: 03/17/2020 CLINICAL DATA:  Fall.  Right facial laceration. EXAM: CT HEAD WITHOUT CONTRAST CT MAXILLOFACIAL WITHOUT CONTRAST CT CERVICAL SPINE WITHOUT CONTRAST TECHNIQUE: Multidetector CT imaging of the head, cervical spine, and maxillofacial structures were performed using the standard protocol without intravenous contrast. Multiplanar CT image reconstructions of the cervical spine and maxillofacial structures were also generated. COMPARISON:  Head CT 08/26/2008 FINDINGS: CT HEAD FINDINGS Brain: Tiny focus of subarachnoid hemorrhage identified anterior right frontal lobe (axial 23/3 and coronal image 9/series 4). Although not definite, there may be trace subdural blood along the anterior falx cranially. Diffuse loss of parenchymal volume is consistent with atrophy. Patchy low attenuation in the deep hemispheric and periventricular white matter is nonspecific, but likely reflects chronic microvascular ischemic demyelination. Vascular: No hyperdense vessel or unexpected calcification. Skull:  No evidence for fracture. No worrisome lytic or sclerotic lesion. Other: None. CT MAXILLOFACIAL FINDINGS Osseous: Minimally displaced age-indeterminate nasal bone fractures, potentially chronic. Otherwise no evidence for an acute fracture in the bony anatomy of the face. Temporomandibular joints are located bilaterally. Orbits: Negative. No traumatic or inflammatory finding. Sinuses: Trace mucosal disease noted in both maxillary sinuses with tiny left maxillary air-fluid level. The remaining paranasal sinuses and mastoid air cells are clear. Soft tissues: Soft tissue swelling noted over the right orbital region. CT CERVICAL SPINE FINDINGS Alignment: Mild accentuation cervical lordosis without subluxation. Skull base and vertebrae: No acute fracture. No primary bone lesion or focal pathologic process. Soft tissues and spinal canal: No prevertebral fluid or swelling. No visible canal hematoma. Disc levels: Mild loss of disc height noted C2-3 and C4-5. Facet degeneration noted upper cervical levels bilaterally. Upper chest: Emphysema noted in the lung apices. Other: None. IMPRESSION: 1. Tiny focus of subarachnoid hemorrhage identified anterior right frontal lobe. Although not definite, there may be trace subdural blood along the anterior falx cranially. 2. Atrophy with chronic small vessel white matter ischemic disease. 3. Minimally displaced age-indeterminate nasal bone fractures, potentially chronic. 4. Soft tissue swelling over the right orbital region. 5. No cervical spine fracture. Loss of cervical lordosis. This can be related to patient positioning, muscle spasm or soft tissue injury. Critical Value/emergent results were called by me at the time of interpretation on 03/17/2020 at 7:25 am to provider Cherika Jessie , who verbally acknowledged these results. Electronically Signed   By: Misty Stanley M.D.   On: 03/17/2020 07:25    ____________________________________________   PROCEDURES  Procedure(s) performed  (including Critical Care):  .1-3 Lead EKG Interpretation Performed by: Paulette Blanch, MD Authorized by: Paulette Blanch, MD     Interpretation: abnormal     ECG rate:  103   ECG rate assessment: tachycardic     Rhythm: sinus tachycardia     Ectopy: none     Conduction: normal   Comments:     Patient placed on cardiac monitor to evaluate for arrhythmias  .Marland KitchenLaceration Repair  Date/Time: 03/17/2020 6:41 AM Performed by: Paulette Blanch, MD Authorized by: Paulette Blanch, MD   Consent:    Consent obtained:  Verbal   Consent given by:  Patient   Risks discussed:  Infection, pain, poor cosmetic result and poor wound healing Anesthesia (see MAR for exact dosages):    Anesthesia method:  Local infiltration   Local anesthetic:  Lidocaine 2% WITH epi Laceration details:    Location:  Face   Face location:  R eyebrow   Length (cm):  1   Depth (mm):  1 Repair type:    Repair type:  Simple Pre-procedure details:  Preparation:  Patient was prepped and draped in usual sterile fashion Exploration:    Hemostasis achieved with:  Direct pressure   Wound exploration: entire depth of wound probed and visualized     Contaminated: no   Treatment:    Area cleansed with:  Saline   Amount of cleaning:  Standard   Irrigation solution:  Sterile saline   Irrigation method:  Tap and syringe   Visualized foreign bodies/material removed: no   Skin repair:    Repair method:  Sutures   Suture size:  5-0   Suture material:  Nylon   Number of sutures:  2 Approximation:    Approximation:  Loose Post-procedure details:    Patient tolerance of procedure:  Tolerated well, no immediate complications .Marland KitchenLaceration Repair  Date/Time: 03/17/2020 6:42 AM Performed by: Paulette Blanch, MD Authorized by: Paulette Blanch, MD   Consent:    Consent obtained:  Verbal   Consent given by:  Patient   Risks discussed:  Infection, pain, poor cosmetic result and poor wound healing Anesthesia (see MAR for exact dosages):     Anesthesia method:  Local infiltration   Local anesthetic:  Lidocaine 1% w/o epi and lidocaine 2% WITH epi Laceration details:    Location:  Face   Face location:  R eyebrow (right lateral eyebrow)   Length (cm):  1.5   Depth (mm):  1 Repair type:    Repair type:  Simple Exploration:    Hemostasis achieved with:  Direct pressure   Wound exploration: entire depth of wound probed and visualized     Contaminated: no   Treatment:    Area cleansed with:  Saline   Amount of cleaning:  Standard   Irrigation solution:  Sterile saline   Irrigation method:  Syringe   Visualized foreign bodies/material removed: no   Skin repair:    Repair method:  Sutures   Suture size:  5-0   Suture material:  Nylon   Number of sutures:  3 Approximation:    Approximation:  Close Post-procedure details:    Dressing:  Sterile dressing   Patient tolerance of procedure:  Tolerated well, no immediate complications     ____________________________________________   INITIAL IMPRESSION / ASSESSMENT AND PLAN / ED COURSE  As part of my medical decision making, I reviewed the following data within the [facility] notes reviewed and incorporated, Labs reviewed, EKG interpreted, Old chart reviewed, Radiograph reviewed , Notes from prior ED visits and [place] was evaluated in Emergency Department on 03/17/2020 for the symptoms described in the history of present illness. He was evaluated in the context of the global COVID-19 pandemic, which necessitated consideration that the patient might be at risk for infection with the SARS-CoV-2 virus that causes COVID-19. Institutional protocols and algorithms that pertain to the evaluation of patients at risk for COVID-19 are in a state of rapid change based on information released by regulatory bodies including the CDC and federal and state organizations. These policies and algorithms were followed during  the patient's care in the ED.    69 year old male presenting with head, right shoulder and right hip pain status post fall secondary to dizziness.  Differential diagnosis includes but is not limited to [place], shoulder dislocation, humerus fracture, hip fracture, ACS, etc.  Will obtain lab work, CT head, x-rays of shoulder and right hip.  Administer IV morphine for pain and reassess.  Clinical Course as of  Mar 17 744  Mon Mar 17, 2020  0659 Patient tolerated suture repair well.  Care transferred to Dr. Tamala Julian at change of shift pending lab work and imaging studies.   [JS]    Clinical Course User Index [JS] Paulette Blanch, MD     ____________________________________________   FINAL CLINICAL IMPRESSION(S) / ED DIAGNOSES  Final diagnoses:  Fall, initial encounter  Facial laceration, initial encounter     ED Discharge Orders    None       Note:  This document was prepared using Dragon voice recognition software and may include unintentional dictation errors.   Paulette Blanch, MD 03/17/20 [phone]

## 2020-03-17 NOTE — ED Triage Notes (Signed)
Pt got up to use restroom this am, became dizzy and fell. Pt with injury noted to right shoulder, arm, head. Abrasion and bruising noted to lateral right knee, puncture wound noted to right anterior shoulder and laceration noted to right face.

## 2020-03-17 NOTE — ED Notes (Signed)
Emergent blood is on pressure bag.

## 2020-03-17 NOTE — ED Notes (Signed)
Second unit emergent blood infusing on transfer.  Report to [facility] at bedside.  VSS upon leaving ED.

## 2020-03-17 NOTE — ED Notes (Signed)
patient signed paper transfer form, placed in med rec

## 2020-03-18 LAB — BPAM RBC
Blood Product Expiration Date: 202109082359
Blood Product Expiration Date: 202109082359
ISSUE DATE / TIME: 202108300920
ISSUE DATE / TIME: 202108300920
Unit Type and Rh: 5100
Unit Type and Rh: 5100

## 2020-03-18 LAB — BPAM FFP
Blood Product Expiration Date: 202109012359
ISSUE DATE / TIME: 202108300948
Unit Type and Rh: 6200

## 2020-03-18 LAB — TYPE AND SCREEN
ABO/RH(D): A POS
Antibody Screen: NEGATIVE
Unit division: 0
Unit division: 0

## 2020-03-18 LAB — PREPARE RBC (CROSSMATCH)

## 2020-03-18 LAB — PREPARE FRESH FROZEN PLASMA: Unit division: 0

## 2020-05-21 ENCOUNTER — Other Ambulatory Visit: Payer: Self-pay

## 2020-05-21 ENCOUNTER — Emergency Department: Payer: Medicare Other

## 2020-05-21 ENCOUNTER — Encounter: Payer: Self-pay | Admitting: Emergency Medicine

## 2020-05-21 ENCOUNTER — Emergency Department
Admission: EM | Admit: 2020-05-21 | Discharge: 2020-05-21 | Disposition: A | Discharge status: Home / Self Care | Payer: Medicare Other | Attending: Emergency Medicine | Admitting: Emergency Medicine

## 2020-05-21 DIAGNOSIS — J441 Chronic obstructive pulmonary disease with (acute) exacerbation: Secondary | ICD-10-CM | POA: Diagnosis not present

## 2020-05-21 DIAGNOSIS — Z7982 Long term (current) use of aspirin: Secondary | ICD-10-CM | POA: Insufficient documentation

## 2020-05-21 DIAGNOSIS — K5903 Drug induced constipation: Secondary | ICD-10-CM | POA: Insufficient documentation

## 2020-05-21 DIAGNOSIS — Z859 Personal history of malignant neoplasm, unspecified: Secondary | ICD-10-CM | POA: Diagnosis not present

## 2020-05-21 DIAGNOSIS — Z7901 Long term (current) use of anticoagulants: Secondary | ICD-10-CM | POA: Insufficient documentation

## 2020-05-21 DIAGNOSIS — E871 Hypo-osmolality and hyponatremia: Secondary | ICD-10-CM | POA: Diagnosis not present

## 2020-05-21 DIAGNOSIS — Z79899 Other long term (current) drug therapy: Secondary | ICD-10-CM | POA: Diagnosis not present

## 2020-05-21 DIAGNOSIS — R1084 Generalized abdominal pain: Secondary | ICD-10-CM

## 2020-05-21 DIAGNOSIS — F17210 Nicotine dependence, cigarettes, uncomplicated: Secondary | ICD-10-CM | POA: Insufficient documentation

## 2020-05-21 DIAGNOSIS — K5641 Fecal impaction: Secondary | ICD-10-CM

## 2020-05-21 DIAGNOSIS — I482 Chronic atrial fibrillation, unspecified: Secondary | ICD-10-CM | POA: Insufficient documentation

## 2020-05-21 LAB — URINALYSIS, COMPLETE (UACMP) WITH MICROSCOPIC
Bacteria, UA: NONE SEEN
Bilirubin Urine: NEGATIVE
Glucose, UA: NEGATIVE mg/dL
Hgb urine dipstick: NEGATIVE
Ketones, ur: NEGATIVE mg/dL
Leukocytes,Ua: NEGATIVE
Nitrite: NEGATIVE
Protein, ur: NEGATIVE mg/dL
Specific Gravity, Urine: 1.015 (ref 1.005–1.030)
Squamous Epithelial / HPF: NONE SEEN (ref 0–5)
pH: 7 (ref 5.0–8.0)

## 2020-05-21 LAB — COMPREHENSIVE METABOLIC PANEL
ALT: 10 U/L (ref 0–44)
AST: 26 U/L (ref 15–41)
Albumin: 4.2 g/dL (ref 3.5–5.0)
Alkaline Phosphatase: 88 U/L (ref 38–126)
Anion gap: 11 (ref 5–15)
BUN: 10 mg/dL (ref 8–23)
CO2: 30 mmol/L (ref 22–32)
Calcium: 9.2 mg/dL (ref 8.9–10.3)
Chloride: 84 mmol/L — ABNORMAL LOW (ref 98–111)
Creatinine, Ser: 0.66 mg/dL (ref 0.61–1.24)
GFR, Estimated: >60 mL/min (ref >60)
Glucose, Bld: 96 mg/dL (ref 70–99)
Potassium: 3.4 mmol/L — ABNORMAL LOW (ref 3.5–5.1)
Sodium: 125 mmol/L — ABNORMAL LOW (ref 135–145)
Total Bilirubin: 1.2 mg/dL (ref 0.3–1.2)
Total Protein: 7.1 g/dL (ref 6.5–8.1)

## 2020-05-21 LAB — CBC WITH DIFFERENTIAL/PLATELET
Abs Immature Granulocytes: 0.05 10*3/uL (ref 0.00–0.07)
Basophils Absolute: 0 10*3/uL (ref 0.0–0.1)
Basophils Relative: 0 %
Eosinophils Absolute: 0 10*3/uL (ref 0.0–0.5)
Eosinophils Relative: 0 %
HCT: 43.4 % (ref 39.0–52.0)
Hemoglobin: 14.5 g/dL (ref 13.0–17.0)
Immature Granulocytes: 0 %
Lymphocytes Relative: 13 %
Lymphs Abs: 1.5 10*3/uL (ref 0.7–4.0)
MCH: 30.9 pg (ref 26.0–34.0)
MCHC: 33.4 g/dL (ref 30.0–36.0)
MCV: 92.3 fL (ref 80.0–100.0)
Monocytes Absolute: 1.3 10*3/uL — ABNORMAL HIGH (ref 0.1–1.0)
Monocytes Relative: 11 %
Neutro Abs: 8.7 10*3/uL — ABNORMAL HIGH (ref 1.7–7.7)
Neutrophils Relative %: 76 %
Platelets: 242 10*3/uL (ref 150–400)
RBC: 4.7 MIL/uL (ref 4.22–5.81)
RDW: 14.4 % (ref 11.5–15.5)
WBC: 11.6 10*3/uL — ABNORMAL HIGH (ref 4.0–10.5)
nRBC: 0 % (ref 0.0–0.2)

## 2020-05-21 LAB — LIPASE, BLOOD: Lipase: 39 U/L (ref 11–51)

## 2020-05-21 MED ORDER — MORPHINE SULFATE (PF) 4 MG/ML IV SOLN
4.0000 mg | Freq: Once | INTRAVENOUS | Status: AC
  Administered 2020-05-21: 4 mg via INTRAVENOUS
  Filled 2020-05-21: qty 1

## 2020-05-21 MED ORDER — IOHEXOL 300 MG/ML  SOLN
100.0000 mL | Freq: Once | INTRAMUSCULAR | Status: AC | PRN
  Administered 2020-05-21: 100 mL via INTRAVENOUS

## 2020-05-21 MED ORDER — DOCUSATE SODIUM 100 MG PO CAPS: 100.0000 mg | ORAL_CAPSULE | Freq: Two times a day (BID) | ORAL | 2 refills | Status: AC

## 2020-05-21 MED ORDER — ONDANSETRON HCL 4 MG/2ML IJ SOLN
4.0000 mg | Freq: Once | INTRAMUSCULAR | Status: AC
  Administered 2020-05-21: 4 mg via INTRAVENOUS
  Filled 2020-05-21: qty 2

## 2020-05-21 MED ORDER — OXYCODONE-ACETAMINOPHEN 5-325 MG PO TABS: 1.0000 | ORAL_TABLET | ORAL | 0 refills | Status: AC | PRN

## 2020-05-21 MED ORDER — LACTATED RINGERS IV BOLUS
1000.0000 mL | Freq: Once | INTRAVENOUS | Status: AC
  Administered 2020-05-21: 1000 mL via INTRAVENOUS

## 2020-05-21 MED ORDER — IPRATROPIUM-ALBUTEROL 0.5-2.5 (3) MG/3ML IN SOLN
3.0000 mL | Freq: Once | RESPIRATORY_TRACT | Status: AC
  Administered 2020-05-21: 3 mL via RESPIRATORY_TRACT
  Filled 2020-05-21: qty 3

## 2020-05-21 MED ORDER — OXYCODONE-ACETAMINOPHEN 5-325 MG PO TABS
1.0000 | ORAL_TABLET | Freq: Once | ORAL | Status: AC
  Administered 2020-05-21: 1 via ORAL
  Filled 2020-05-21: qty 1

## 2020-05-21 MED ORDER — HYDROCORTISONE ACETATE 25 MG RE SUPP: 25.0000 mg | Freq: Two times a day (BID) | RECTAL | 1 refills | Status: AC

## 2020-05-21 NOTE — Discharge Instructions (Signed)

## 2020-05-21 NOTE — ED Notes (Signed)
Patient called daughter to ride home. Patient non ambulatory. Patients daughter has key to his house and he does not have way to get into home until daughter gets off of work.

## 2020-05-21 NOTE — ED Provider Notes (Signed)
[facility] Emergency Department Provider Note   ____________________________________________   First MD Initiated Contact with Patient 05/21/20 [phone]     (approximate)  I have reviewed the triage vital signs and the nursing notes.   HISTORY  Chief Complaint Abdominal Pain    HPI Darren Coleman is a 69 y.o. male with possible history of COPD and atrial fibrillation who presents to the ED complaining of abdominal pain.  Patient reports that he has not had a bowel movement in almost 2 weeks, is starting to develop diffuse abdominal pain and distention.  He reports associated nausea, but has not vomited.  He also feels like there is something stuck at his rectum, which is causing him significant pain.  He notes occasional leakage of stool as well as rectal bleeding when he goes to wipe.  He does take narcotic pain medication chronically at this point following a fall 2 months ago with multiple fractures.  He reports taking a stool softener but no laxatives.  He is no longer anticoagulated for atrial fibrillation following this fall with intra-abdominal bleeding.  He also feels like his COPD is flaring up over the past couple of days with some mild difficulty breathing, denies any fevers, cough, or chest pain.        Past Medical History:  Diagnosis Date  . Cancer ([place])   . COPD (chronic obstructive pulmonary disease) [facility])     Patient Active Problem List   Diagnosis Date Noted  . Pressure injury of skin 01/09/2019  . Closed right hip fracture ([place]) 01/03/2019  . COPD (chronic obstructive pulmonary disease) ([place]) 01/03/2019    Past Surgical History:  Procedure Laterality Date  . COLON SURGERY    . INTRAMEDULLARY (IM) NAIL INTERTROCHANTERIC Right 01/03/2019   Procedure: INTRAMEDULLARY (IM) NAIL INTERTROCHANTRIC;  Surgeon: Corky Mull, MD;  Location: [facility] ORS;  Service: Orthopedics;  Laterality: Right;    Prior to Admission medications   Medication  Sig Start Date End Date Taking? Authorizing Provider  acetaminophen (TYLENOL) 500 MG tablet Take 500 mg by mouth every 6 (six) hours as needed for pain.    [provider]  albuterol (VENTOLIN HFA) 108 (90 Base) MCG/ACT inhaler Inhale 2 puffs into the lungs every 6 (six) hours as needed for wheezing. 01/08/15   [provider]  apixaban (ELIQUIS) 5 MG TABS tablet Take 1 tablet (5 mg total) by mouth 2 (two) times daily. 01/10/19   Epifanio Lesches, MD  aspirin EC 81 MG tablet Take 81 mg by mouth daily.    [provider]  atorvastatin (LIPITOR) 40 MG tablet Take 40 mg by mouth daily. 08/11/18   [provider]  docusate sodium (COLACE) 100 MG capsule Take 1 capsule (100 mg total) by mouth 2 (two) times daily. 05/21/20 05/21/21  Blake Divine, MD  ferrous sulfate 325 (65 FE) MG tablet Take 1 tablet (325 mg total) by mouth 2 (two) times daily with a meal. 01/10/19   Epifanio Lesches, MD  Fluticasone-Umeclidin-Vilant 100-62.5-25 MCG/INH AEPB Inhale 1 puff into the lungs daily. 10/19/18   [provider]  furosemide (LASIX) 40 MG tablet Take 40 mg by mouth daily. 03/13/18   [provider]  hydrocortisone (ANUSOL-HC) 25 MG suppository Place 1 suppository (25 mg total) rectally every 12 (twelve) hours. 05/21/20 05/21/21  Blake Divine, MD  methocarbamol (ROBAXIN) 500 MG tablet Take 500 mg by mouth 4 (four) times daily. 11/02/18   [provider]  metoprolol succinate (TOPROL-XL) 50  MG 24 hr tablet Take 1 tablet (50 mg total) by mouth daily. Take with or immediately following a meal. 01/11/19   Epifanio Lesches, MD  oxyCODONE (OXY IR/ROXICODONE) 5 MG immediate release tablet Take 1-2 tablets (5-10 mg total) by mouth every 4 (four) hours as needed for moderate pain (pain score 4-6). 01/06/19   Reche Dixon, PA-C  oxyCODONE-acetaminophen (PERCOCET) 5-325 MG tablet Take 1 tablet by mouth every 4 (four) hours as needed for severe pain. 05/21/20  05/21/21  Blake Divine, MD  predniSONE (DELTASONE) 10 MG tablet Take 10 mg by mouth daily. 11/23/18   [provider]  Saline (AYR SALINE NASAL DROPS) 0.65 % (Soln) SOLN Place 2 drops into both nostrils every 4 (four) hours. 01/24/18   [provider]  tadalafil (CIALIS) 5 MG tablet Take 5 mg by mouth as needed. 09/01/18   [provider]  tiZANidine (ZANAFLEX) 4 MG tablet Take 4 mg by mouth every 6 (six) hours as needed for muscle spasms. 12/29/18   [provider]  traMADol (ULTRAM) 50 MG tablet Take 1 tablet (50 mg total) by mouth every 6 (six) hours. 01/06/19   Reche Dixon, PA-C    Allergies Sulfa antibiotics  No family history on file.  Social History Social History   Tobacco Use  . Smoking status: Current Every Day Smoker    Packs/day: 0.50    Types: Cigarettes  . Smokeless tobacco: Never Used  Substance Use Topics  . Alcohol use: Yes    Comment: sporadic  . Drug use: Never    Review of Systems  Constitutional: No fever/chills Eyes: No visual changes. ENT: No sore throat. Cardiovascular: Denies chest pain. Respiratory: Denies shortness of breath. Gastrointestinal: Positive for abdominal pain, nausea, no vomiting.  No diarrhea.  Positive for constipation. Genitourinary: Negative for dysuria. Musculoskeletal: Negative for back pain. Skin: Negative for rash. Neurological: Negative for headaches, focal weakness or numbness.  ____________________________________________   PHYSICAL EXAM:  VITAL SIGNS: ED Triage Vitals  Enc Vitals Group     BP 05/21/20 0646 (!) 182/100     Pulse Rate 05/21/20 0646 77     Resp 05/21/20 0646 18     Temp 05/21/20 0646 97.9 F (36.6 C)     Temp Source 05/21/20 0646 Oral     SpO2 05/21/20 0644 95 %     Weight 05/21/20 0646 122 lb (55.3 kg)     Height 05/21/20 0646 5\' 6"  (1.676 m)     Head Circumference --      Peak Flow --      Pain Score 05/21/20 0646 10     Pain Loc --      Pain Edu? --       Excl. in [place]? --     Constitutional: Alert and oriented. Eyes: Conjunctivae are normal. Head: Atraumatic. Nose: No congestion/rhinnorhea. Mouth/Throat: Mucous membranes are moist. Neck: Normal ROM Cardiovascular: Normal rate, regular rhythm. Grossly normal heart sounds. Respiratory: Normal respiratory effort.  No retractions. Lungs with mild end expiratory wheezing. Gastrointestinal: Soft and diffusely tender to palpation with no rebound or guarding.  Mild distention noted. Genitourinary: deferred Musculoskeletal: No lower extremity tenderness nor edema. Neurologic:  Normal speech and language. No gross focal neurologic deficits are appreciated. Skin:  Skin is warm, dry and intact. No rash noted. Psychiatric: Mood and affect are normal. Speech and behavior are normal.  ____________________________________________   LABS (all labs ordered are listed, but only abnormal results are displayed)  Labs Reviewed  CBC WITH  DIFFERENTIAL/PLATELET - Abnormal; Notable for the following components:      Result Value   WBC 11.6 (*)    Neutro Abs 8.7 (*)    Monocytes Absolute 1.3 (*)    All other components within normal limits  COMPREHENSIVE METABOLIC PANEL - Abnormal; Notable for the following components:   Sodium 125 (*)    Potassium 3.4 (*)    Chloride 84 (*)    All other components within normal limits  URINALYSIS, COMPLETE (UACMP) WITH MICROSCOPIC - Abnormal; Notable for the following components:   Color, Urine YELLOW (*)    APPearance CLEAR (*)    All other components within normal limits  LIPASE, BLOOD   ____________________________________________  EKG  ED ECG REPORT I, Blake Divine, the attending physician, personally viewed and interpreted this ECG.   Date: 05/21/2020  EKG Time: 8:42  Rate: 78  Rhythm: normal sinus rhythm  Axis: Normal  Intervals:right bundle branch block  ST&T Change: None, similar to previous   PROCEDURES  Procedure(s) performed (including  Critical Care):  Procedures  ------------------------------------------------------------------------------------------------------------------- Fecal Disimpaction Procedure Note:  Performed by me:  Patient placed in the lateral recumbent position with knees drawn towards chest. Nurse present for patient support. Large amount of hard brown stool removed. No complications during procedure.   ------------------------------------------------------------------------------------------------------------------  ____________________________________________   INITIAL IMPRESSION / ASSESSMENT AND PLAN / ED COURSE       69 year old male with past medical history of COPD and atrial fibrillation, not currently anticoagulated, who presents to the ED for diffuse abdominal pain and constipation for almost the past 2 weeks.  Abdominal x-ray performed from triage and images reviewed by me, no obvious obstructive pattern although he does appear to have a significant stool burden.  He reports significant pressure near his rectum and will likely require disimpaction, will also assess for rectal bleeding at this time, but H&H is stable and I doubt significant GI bleed.  We will also perform CT abdomen/pelvis to rule out bowel obstruction.  Lab work reassuring thus far, LFTs and lipase within normal limits.  He does have hyponatremia, which appears to be an acute on chronic issue.  He does not appear to have any symptoms related to this. He does endorse some mild COPD symptoms, which we will treat with DuoNeb.  No significant increased work of breathing on my assessment.  Patient reports feeling better following DuoNeb and states his breathing is back to baseline.  CT scan is negative for bowel obstruction but does show significant amount of retained stool along with impacted stool at the rectum.  Disimpaction was performed, no significant bleeding noted.  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  Constipation is likely due in large part to his chronic opiate pain medications and he was advised that he will need to gradually wean these in order to help with his constipation.  Given his healing fractures, we will prescribe very short course of additional pain medication.  He is currently taking a stool softener and was advised to add fiber supplement as well as MiraLAX.  He was counseled to follow-up with his PCP and return to the ED for new or worsening symptoms, patient agrees with plan.      ____________________________________________   FINAL CLINICAL IMPRESSION(S) / ED DIAGNOSES  Final diagnoses:  Generalized abdominal pain  Drug-induced constipation  Fecal impaction ([place])  Hyponatremia     ED Discharge Orders  Ordered    hydrocortisone (ANUSOL-HC) 25 MG suppository  Every 12 hours        05/21/20 1153    docusate sodium (COLACE) 100 MG capsule  2 times daily        05/21/20 1153    oxyCODONE-acetaminophen (PERCOCET) 5-325 MG tablet  Every 4 hours PRN        05/21/20 1153           Note:  This document was prepared using Dragon voice recognition software and may include unintentional dictation errors.   Blake Divine, MD 05/21/20 1158

## 2020-05-21 NOTE — ED Triage Notes (Addendum)
EMS brings pt in from home; to triage via w/c with no distress noted; pt c/o mid lower abd pain x 3-4 days with rectal pain; st last BM "16 days ago" unrelieved by OTC; denies any N/V

## 2020-07-20 ENCOUNTER — Emergency Department: Payer: Medicare Other

## 2020-07-20 ENCOUNTER — Encounter: Payer: Self-pay | Admitting: Emergency Medicine

## 2020-07-20 ENCOUNTER — Inpatient Hospital Stay
Admission: EM | Admit: 2020-07-20 | Discharge: 2020-08-19 | DRG: 871 | Disposition: E | Payer: Medicare Other | Attending: Pulmonary Disease | Admitting: Pulmonary Disease

## 2020-07-20 ENCOUNTER — Other Ambulatory Visit: Payer: Self-pay

## 2020-07-20 DIAGNOSIS — Z978 Presence of other specified devices: Secondary | ICD-10-CM

## 2020-07-20 DIAGNOSIS — R0602 Shortness of breath: Secondary | ICD-10-CM

## 2020-07-20 DIAGNOSIS — F17210 Nicotine dependence, cigarettes, uncomplicated: Secondary | ICD-10-CM | POA: Diagnosis present

## 2020-07-20 DIAGNOSIS — J449 Chronic obstructive pulmonary disease, unspecified: Secondary | ICD-10-CM | POA: Diagnosis not present

## 2020-07-20 DIAGNOSIS — Z0189 Encounter for other specified special examinations: Secondary | ICD-10-CM

## 2020-07-20 DIAGNOSIS — J189 Pneumonia, unspecified organism: Secondary | ICD-10-CM | POA: Diagnosis present

## 2020-07-20 DIAGNOSIS — E43 Unspecified severe protein-calorie malnutrition: Secondary | ICD-10-CM | POA: Diagnosis present

## 2020-07-20 DIAGNOSIS — A4102 Sepsis due to Methicillin resistant Staphylococcus aureus: Secondary | ICD-10-CM | POA: Diagnosis present

## 2020-07-20 DIAGNOSIS — R778 Other specified abnormalities of plasma proteins: Secondary | ICD-10-CM | POA: Diagnosis present

## 2020-07-20 DIAGNOSIS — Z20822 Contact with and (suspected) exposure to covid-19: Secondary | ICD-10-CM | POA: Diagnosis present

## 2020-07-20 DIAGNOSIS — J15212 Pneumonia due to Methicillin resistant Staphylococcus aureus: Secondary | ICD-10-CM | POA: Diagnosis not present

## 2020-07-20 DIAGNOSIS — I452 Bifascicular block: Secondary | ICD-10-CM | POA: Diagnosis present

## 2020-07-20 DIAGNOSIS — D696 Thrombocytopenia, unspecified: Secondary | ICD-10-CM | POA: Diagnosis present

## 2020-07-20 DIAGNOSIS — J9601 Acute respiratory failure with hypoxia: Secondary | ICD-10-CM

## 2020-07-20 DIAGNOSIS — R001 Bradycardia, unspecified: Secondary | ICD-10-CM | POA: Diagnosis not present

## 2020-07-20 DIAGNOSIS — I110 Hypertensive heart disease with heart failure: Secondary | ICD-10-CM | POA: Diagnosis present

## 2020-07-20 DIAGNOSIS — Z882 Allergy status to sulfonamides status: Secondary | ICD-10-CM

## 2020-07-20 DIAGNOSIS — J9621 Acute and chronic respiratory failure with hypoxia: Secondary | ICD-10-CM | POA: Diagnosis present

## 2020-07-20 DIAGNOSIS — Z6820 Body mass index (BMI) 20.0-20.9, adult: Secondary | ICD-10-CM

## 2020-07-20 DIAGNOSIS — Z9981 Dependence on supplemental oxygen: Secondary | ICD-10-CM

## 2020-07-20 DIAGNOSIS — L89103 Pressure ulcer of unspecified part of back, stage 3: Secondary | ICD-10-CM | POA: Diagnosis present

## 2020-07-20 DIAGNOSIS — Z452 Encounter for adjustment and management of vascular access device: Secondary | ICD-10-CM

## 2020-07-20 DIAGNOSIS — A419 Sepsis, unspecified organism: Secondary | ICD-10-CM

## 2020-07-20 DIAGNOSIS — J440 Chronic obstructive pulmonary disease with acute lower respiratory infection: Secondary | ICD-10-CM | POA: Diagnosis present

## 2020-07-20 DIAGNOSIS — R6521 Severe sepsis with septic shock: Secondary | ICD-10-CM | POA: Diagnosis present

## 2020-07-20 DIAGNOSIS — L89611 Pressure ulcer of right heel, stage 1: Secondary | ICD-10-CM | POA: Diagnosis present

## 2020-07-20 DIAGNOSIS — I462 Cardiac arrest due to underlying cardiac condition: Secondary | ICD-10-CM | POA: Diagnosis not present

## 2020-07-20 DIAGNOSIS — R6520 Severe sepsis without septic shock: Secondary | ICD-10-CM

## 2020-07-20 DIAGNOSIS — L89621 Pressure ulcer of left heel, stage 1: Secondary | ICD-10-CM | POA: Diagnosis present

## 2020-07-20 DIAGNOSIS — E785 Hyperlipidemia, unspecified: Secondary | ICD-10-CM | POA: Diagnosis present

## 2020-07-20 DIAGNOSIS — M818 Other osteoporosis without current pathological fracture: Secondary | ICD-10-CM | POA: Diagnosis present

## 2020-07-20 DIAGNOSIS — Z7951 Long term (current) use of inhaled steroids: Secondary | ICD-10-CM

## 2020-07-20 DIAGNOSIS — J9622 Acute and chronic respiratory failure with hypercapnia: Secondary | ICD-10-CM | POA: Diagnosis present

## 2020-07-20 DIAGNOSIS — L89312 Pressure ulcer of right buttock, stage 2: Secondary | ICD-10-CM | POA: Diagnosis present

## 2020-07-20 DIAGNOSIS — I469 Cardiac arrest, cause unspecified: Secondary | ICD-10-CM | POA: Diagnosis not present

## 2020-07-20 DIAGNOSIS — R1310 Dysphagia, unspecified: Secondary | ICD-10-CM | POA: Diagnosis not present

## 2020-07-20 DIAGNOSIS — K219 Gastro-esophageal reflux disease without esophagitis: Secondary | ICD-10-CM | POA: Diagnosis present

## 2020-07-20 DIAGNOSIS — R112 Nausea with vomiting, unspecified: Secondary | ICD-10-CM

## 2020-07-20 DIAGNOSIS — E871 Hypo-osmolality and hyponatremia: Secondary | ICD-10-CM

## 2020-07-20 DIAGNOSIS — E872 Acidosis, unspecified: Secondary | ICD-10-CM

## 2020-07-20 DIAGNOSIS — Z66 Do not resuscitate: Secondary | ICD-10-CM | POA: Diagnosis not present

## 2020-07-20 DIAGNOSIS — Z7952 Long term (current) use of systemic steroids: Secondary | ICD-10-CM

## 2020-07-20 DIAGNOSIS — E876 Hypokalemia: Secondary | ICD-10-CM

## 2020-07-20 DIAGNOSIS — Z7983 Long term (current) use of bisphosphonates: Secondary | ICD-10-CM

## 2020-07-20 DIAGNOSIS — K760 Fatty (change of) liver, not elsewhere classified: Secondary | ICD-10-CM | POA: Diagnosis present

## 2020-07-20 DIAGNOSIS — E861 Hypovolemia: Secondary | ICD-10-CM | POA: Diagnosis present

## 2020-07-20 DIAGNOSIS — I5032 Chronic diastolic (congestive) heart failure: Secondary | ICD-10-CM | POA: Diagnosis present

## 2020-07-20 DIAGNOSIS — R296 Repeated falls: Secondary | ICD-10-CM | POA: Diagnosis present

## 2020-07-20 DIAGNOSIS — Z79899 Other long term (current) drug therapy: Secondary | ICD-10-CM

## 2020-07-20 DIAGNOSIS — L8990 Pressure ulcer of unspecified site, unspecified stage: Secondary | ICD-10-CM | POA: Diagnosis present

## 2020-07-20 DIAGNOSIS — Z9181 History of falling: Secondary | ICD-10-CM

## 2020-07-20 DIAGNOSIS — R197 Diarrhea, unspecified: Secondary | ICD-10-CM | POA: Diagnosis present

## 2020-07-20 DIAGNOSIS — Z9049 Acquired absence of other specified parts of digestive tract: Secondary | ICD-10-CM

## 2020-07-20 DIAGNOSIS — E878 Other disorders of electrolyte and fluid balance, not elsewhere classified: Secondary | ICD-10-CM | POA: Diagnosis present

## 2020-07-20 DIAGNOSIS — Z7982 Long term (current) use of aspirin: Secondary | ICD-10-CM

## 2020-07-20 DIAGNOSIS — Z85038 Personal history of other malignant neoplasm of large intestine: Secondary | ICD-10-CM

## 2020-07-20 DIAGNOSIS — K30 Functional dyspepsia: Secondary | ICD-10-CM | POA: Diagnosis not present

## 2020-07-20 DIAGNOSIS — R54 Age-related physical debility: Secondary | ICD-10-CM | POA: Diagnosis present

## 2020-07-20 DIAGNOSIS — I480 Paroxysmal atrial fibrillation: Secondary | ICD-10-CM | POA: Diagnosis present

## 2020-07-20 DIAGNOSIS — R059 Cough, unspecified: Secondary | ICD-10-CM

## 2020-07-20 DIAGNOSIS — I4891 Unspecified atrial fibrillation: Secondary | ICD-10-CM | POA: Diagnosis not present

## 2020-07-20 DIAGNOSIS — R1084 Generalized abdominal pain: Secondary | ICD-10-CM

## 2020-07-20 DIAGNOSIS — Z8249 Family history of ischemic heart disease and other diseases of the circulatory system: Secondary | ICD-10-CM

## 2020-07-20 DIAGNOSIS — D509 Iron deficiency anemia, unspecified: Secondary | ICD-10-CM | POA: Diagnosis present

## 2020-07-20 HISTORY — DX: Iron deficiency anemia, unspecified: D50.9

## 2020-07-20 HISTORY — DX: Unspecified atrial fibrillation: I48.91

## 2020-07-20 LAB — EXPECTORATED SPUTUM ASSESSMENT W GRAM STAIN, RFLX TO RESP C

## 2020-07-20 LAB — CBC WITH DIFFERENTIAL/PLATELET
Abs Immature Granulocytes: 0.45 10*3/uL — ABNORMAL HIGH (ref 0.00–0.07)
Basophils Absolute: 0.1 10*3/uL (ref 0.0–0.1)
Basophils Relative: 0 %
Eosinophils Absolute: 0.1 10*3/uL (ref 0.0–0.5)
Eosinophils Relative: 0 %
HCT: 39.3 % (ref 39.0–52.0)
Hemoglobin: 13.8 g/dL (ref 13.0–17.0)
Immature Granulocytes: 2 %
Lymphocytes Relative: 1 %
Lymphs Abs: 0.3 10*3/uL — ABNORMAL LOW (ref 0.7–4.0)
MCH: 33 pg (ref 26.0–34.0)
MCHC: 35.1 g/dL (ref 30.0–36.0)
MCV: 94 fL (ref 80.0–100.0)
Monocytes Absolute: 1.7 10*3/uL — ABNORMAL HIGH (ref 0.1–1.0)
Monocytes Relative: 8 %
Neutro Abs: 19.8 10*3/uL — ABNORMAL HIGH (ref 1.7–7.7)
Neutrophils Relative %: 89 %
Platelets: 124 10*3/uL — ABNORMAL LOW (ref 150–400)
RBC: 4.18 MIL/uL — ABNORMAL LOW (ref 4.22–5.81)
RDW: 21 % — ABNORMAL HIGH (ref 11.5–15.5)
Smear Review: NORMAL
WBC: 22.4 10*3/uL — ABNORMAL HIGH (ref 4.0–10.5)
nRBC: 0 % (ref 0.0–0.2)

## 2020-07-20 LAB — BLOOD GAS, VENOUS
Acid-Base Excess: 5 mmol/L — ABNORMAL HIGH (ref 0.0–2.0)
Bicarbonate: 30.5 mmol/L — ABNORMAL HIGH (ref 20.0–28.0)
O2 Saturation: 33.3 %
Patient temperature: 37
pCO2, Ven: 47 mmHg (ref 44.0–60.0)
pH, Ven: 7.42 (ref 7.250–7.430)
pO2, Ven: <31 mmHg — CL (ref 32.0–45.0)

## 2020-07-20 LAB — URINALYSIS, COMPLETE (UACMP) WITH MICROSCOPIC
Bilirubin Urine: NEGATIVE
Glucose, UA: NEGATIVE mg/dL
Ketones, ur: NEGATIVE mg/dL
Leukocytes,Ua: NEGATIVE
Nitrite: NEGATIVE
Protein, ur: 100 mg/dL — AB
Specific Gravity, Urine: 1.018 (ref 1.005–1.030)
pH: 6 (ref 5.0–8.0)

## 2020-07-20 LAB — PROTIME-INR
INR: 1.2 (ref 0.8–1.2)
Prothrombin Time: 14.4 seconds (ref 11.4–15.2)

## 2020-07-20 LAB — COMPREHENSIVE METABOLIC PANEL
ALT: 19 U/L (ref 0–44)
AST: 48 U/L — ABNORMAL HIGH (ref 15–41)
Albumin: 3.4 g/dL — ABNORMAL LOW (ref 3.5–5.0)
Alkaline Phosphatase: 87 U/L (ref 38–126)
Anion gap: 13 (ref 5–15)
BUN: 10 mg/dL (ref 8–23)
CO2: 28 mmol/L (ref 22–32)
Calcium: 8.8 mg/dL — ABNORMAL LOW (ref 8.9–10.3)
Chloride: 76 mmol/L — ABNORMAL LOW (ref 98–111)
Creatinine, Ser: 0.52 mg/dL — ABNORMAL LOW (ref 0.61–1.24)
GFR, Estimated: >60 mL/min (ref >60)
Glucose, Bld: 124 mg/dL — ABNORMAL HIGH (ref 70–99)
Potassium: 3.4 mmol/L — ABNORMAL LOW (ref 3.5–5.1)
Sodium: 117 mmol/L — CL (ref 135–145)
Total Bilirubin: 2.5 mg/dL — ABNORMAL HIGH (ref 0.3–1.2)
Total Protein: 6.8 g/dL (ref 6.5–8.1)

## 2020-07-20 LAB — RESP PANEL BY RT-PCR (FLU A&B, COVID) ARPGX2
Influenza A by PCR: NEGATIVE
Influenza B by PCR: NEGATIVE
SARS Coronavirus 2 by RT PCR: NEGATIVE

## 2020-07-20 LAB — BASIC METABOLIC PANEL
Anion gap: 11 (ref 5–15)
BUN: 14 mg/dL (ref 8–23)
CO2: 26 mmol/L (ref 22–32)
Calcium: 7.8 mg/dL — ABNORMAL LOW (ref 8.9–10.3)
Chloride: 83 mmol/L — ABNORMAL LOW (ref 98–111)
Creatinine, Ser: 0.6 mg/dL — ABNORMAL LOW (ref 0.61–1.24)
GFR, Estimated: >60 mL/min (ref >60)
Glucose, Bld: 97 mg/dL (ref 70–99)
Potassium: 3 mmol/L — ABNORMAL LOW (ref 3.5–5.1)
Sodium: 120 mmol/L — ABNORMAL LOW (ref 135–145)

## 2020-07-20 LAB — APTT: aPTT: 37 seconds — ABNORMAL HIGH (ref 24–36)

## 2020-07-20 LAB — MAGNESIUM: Magnesium: 1.6 mg/dL — ABNORMAL LOW (ref 1.7–2.4)

## 2020-07-20 LAB — BRAIN NATRIURETIC PEPTIDE: B Natriuretic Peptide: 385.1 pg/mL — ABNORMAL HIGH (ref 0.0–100.0)

## 2020-07-20 LAB — OSMOLALITY: Osmolality: 248 mOsm/kg — CL (ref 275–295)

## 2020-07-20 LAB — LACTIC ACID, PLASMA
Lactic Acid, Venous: 2.8 mmol/L (ref 0.5–1.9)
Lactic Acid, Venous: 3.6 mmol/L (ref 0.5–1.9)
Lactic Acid, Venous: 2.9 mmol/L (ref 0.5–1.9)

## 2020-07-20 LAB — TROPONIN I (HIGH SENSITIVITY)
Troponin I (High Sensitivity): 18 ng/L — ABNORMAL HIGH (ref <18)
Troponin I (High Sensitivity): 30 ng/L — ABNORMAL HIGH (ref <18)

## 2020-07-20 LAB — TSH: TSH: 0.46 u[IU]/mL (ref 0.350–4.500)

## 2020-07-20 LAB — PROCALCITONIN: Procalcitonin: 2.58 ng/mL

## 2020-07-20 LAB — SODIUM, URINE, RANDOM: Sodium, Ur: 12 mmol/L

## 2020-07-20 MED ORDER — SODIUM CHLORIDE 0.9 % IV SOLN
2.0000 g | INTRAVENOUS | Status: DC
  Administered 2020-07-20: 2 g via INTRAVENOUS
  Filled 2020-07-20: qty 20

## 2020-07-20 MED ORDER — ONDANSETRON HCL 4 MG PO TABS
4.0000 mg | ORAL_TABLET | Freq: Four times a day (QID) | ORAL | Status: DC | PRN
  Administered 2020-07-20: 4 mg via ORAL
  Filled 2020-07-20: qty 1

## 2020-07-20 MED ORDER — ACETAMINOPHEN 325 MG PO TABS: 650.0000 mg | ORAL_TABLET | Freq: Four times a day (QID) | ORAL | Status: DC | PRN

## 2020-07-20 MED ORDER — ONDANSETRON HCL 4 MG/2ML IJ SOLN: 4.0000 mg | Freq: Four times a day (QID) | INTRAMUSCULAR | Status: DC | PRN

## 2020-07-20 MED ORDER — IOHEXOL 300 MG/ML  SOLN
100.0000 mL | Freq: Once | INTRAMUSCULAR | Status: AC | PRN
  Administered 2020-07-20: 100 mL via INTRAVENOUS

## 2020-07-20 MED ORDER — ACETAMINOPHEN 650 MG RE SUPP
650.0000 mg | Freq: Four times a day (QID) | RECTAL | Status: DC | PRN
  Administered 2020-07-22: 650 mg via RECTAL
  Filled 2020-07-20 (×2): qty 1

## 2020-07-20 MED ORDER — IPRATROPIUM-ALBUTEROL 0.5-2.5 (3) MG/3ML IN SOLN
9.0000 mL | Freq: Once | RESPIRATORY_TRACT | Status: AC
  Administered 2020-07-20: 9 mL via RESPIRATORY_TRACT
  Filled 2020-07-20: qty 3

## 2020-07-20 MED ORDER — DM-GUAIFENESIN ER 30-600 MG PO TB12
1.0000 | ORAL_TABLET | Freq: Two times a day (BID) | ORAL | Status: DC
  Administered 2020-07-20 – 2020-07-22 (×3): 1 via ORAL
  Filled 2020-07-20 (×3): qty 1

## 2020-07-20 MED ORDER — FAMOTIDINE 20 MG PO TABS
40.0000 mg | ORAL_TABLET | Freq: Every day | ORAL | Status: DC
  Administered 2020-07-21: 40 mg via ORAL
  Filled 2020-07-20: qty 2

## 2020-07-20 MED ORDER — NALOXONE HCL 4 MG/0.1ML NA LIQD
4.0000 mg | Freq: Once | NASAL | Status: AC
  Administered 2020-07-21: 4 mg via NASAL
  Filled 2020-07-20: qty 4

## 2020-07-20 MED ORDER — LACTATED RINGERS IV SOLN: INTRAVENOUS | Status: DC

## 2020-07-20 MED ORDER — MAGNESIUM HYDROXIDE 400 MG/5ML PO SUSP: 30.0000 mL | Freq: Every day | ORAL | Status: DC | PRN

## 2020-07-20 MED ORDER — MAGNESIUM SULFATE 2 GM/50ML IV SOLN
2.0000 g | Freq: Once | INTRAVENOUS | Status: AC
  Administered 2020-07-20: 2 g via INTRAVENOUS
  Filled 2020-07-20: qty 50

## 2020-07-20 MED ORDER — POTASSIUM CHLORIDE CRYS ER 20 MEQ PO TBCR
40.0000 meq | EXTENDED_RELEASE_TABLET | Freq: Once | ORAL | Status: AC
  Administered 2020-07-20: 40 meq via ORAL
  Filled 2020-07-20: qty 2

## 2020-07-20 MED ORDER — OXYCODONE-ACETAMINOPHEN 5-325 MG PO TABS
1.0000 | ORAL_TABLET | ORAL | Status: DC | PRN
  Administered 2020-07-20 – 2020-07-21 (×3): 1 via ORAL
  Filled 2020-07-20 (×3): qty 1

## 2020-07-20 MED ORDER — LACTATED RINGERS IV BOLUS: 1000.0000 mL | Freq: Once | INTRAVENOUS | Status: DC

## 2020-07-20 MED ORDER — LACTATED RINGERS IV BOLUS
1000.0000 mL | Freq: Once | INTRAVENOUS | Status: AC
  Administered 2020-07-20: 1000 mL via INTRAVENOUS

## 2020-07-20 MED ORDER — TRAZODONE HCL 50 MG PO TABS
25.0000 mg | ORAL_TABLET | Freq: Every evening | ORAL | Status: DC | PRN
Start: 2020-07-20 — End: 2020-07-23

## 2020-07-20 MED ORDER — GUAIFENESIN ER 600 MG PO TB12
600.0000 mg | ORAL_TABLET | Freq: Two times a day (BID) | ORAL | Status: DC
  Administered 2020-07-20 – 2020-07-22 (×3): 600 mg via ORAL
  Filled 2020-07-20 (×3): qty 1

## 2020-07-20 MED ORDER — SODIUM CHLORIDE 0.9 % IV SOLN
500.0000 mg | INTRAVENOUS | Status: DC
  Administered 2020-07-20: 500 mg via INTRAVENOUS
  Filled 2020-07-20: qty 500

## 2020-07-20 MED ORDER — SODIUM CHLORIDE 0.9 % IV SOLN
2.0000 g | Freq: Once | INTRAVENOUS | Status: AC
  Administered 2020-07-20: 2 g via INTRAVENOUS
  Filled 2020-07-20: qty 2

## 2020-07-20 MED ORDER — ASPIRIN EC 81 MG PO TBEC
81.0000 mg | DELAYED_RELEASE_TABLET | Freq: Every day | ORAL | Status: DC
  Administered 2020-07-21: 81 mg via ORAL
  Filled 2020-07-20: qty 1

## 2020-07-20 MED ORDER — SODIUM CHLORIDE 0.9 % IV SOLN
2.0000 g | INTRAVENOUS | Status: DC
  Administered 2020-07-21 – 2020-07-22 (×2): 2 g via INTRAVENOUS
  Filled 2020-07-20 (×3): qty 20

## 2020-07-20 MED ORDER — DOCUSATE SODIUM 100 MG PO CAPS
100.0000 mg | ORAL_CAPSULE | Freq: Two times a day (BID) | ORAL | Status: DC
  Filled 2020-07-20: qty 1

## 2020-07-20 MED ORDER — IPRATROPIUM-ALBUTEROL 0.5-2.5 (3) MG/3ML IN SOLN
3.0000 mL | Freq: Four times a day (QID) | RESPIRATORY_TRACT | Status: DC
  Administered 2020-07-20: 3 mL via RESPIRATORY_TRACT
  Filled 2020-07-20: qty 3

## 2020-07-20 MED ORDER — POTASSIUM CHLORIDE IN NACL 20-0.9 MEQ/L-% IV SOLN
INTRAVENOUS | Status: DC
  Filled 2020-07-20: qty 1000

## 2020-07-20 MED ORDER — LACTATED RINGERS IV BOLUS
500.0000 mL | Freq: Once | INTRAVENOUS | Status: AC
  Administered 2020-07-20: 500 mL via INTRAVENOUS

## 2020-07-20 MED ORDER — TIZANIDINE HCL 4 MG PO TABS
4.0000 mg | ORAL_TABLET | Freq: Four times a day (QID) | ORAL | Status: DC | PRN
  Filled 2020-07-20: qty 1

## 2020-07-20 MED ORDER — ENOXAPARIN SODIUM 40 MG/0.4ML ~~LOC~~ SOLN
40.0000 mg | SUBCUTANEOUS | Status: DC
  Administered 2020-07-20 – 2020-07-22 (×2): 40 mg via SUBCUTANEOUS
  Filled 2020-07-20 (×2): qty 0.4

## 2020-07-20 NOTE — ED Notes (Signed)
[facility] notified of critical osmolality

## 2020-07-20 NOTE — Progress Notes (Signed)
Code Sepsis initiated @ 1624 PM, [facility] following.

## 2020-07-20 NOTE — ED Notes (Signed)
Admitting MD messaged for order for patient's home oxycodone per patient request.

## 2020-07-20 NOTE — Progress Notes (Signed)
CODE SEPSIS - PHARMACY COMMUNICATION  **Broad Spectrum Antibiotics should be administered within 1 hour of Sepsis diagnosis**  Time Code Sepsis Called/Page Received: 16:23  Antibiotics Ordered: Cefepime  Time of 1st antibiotic administration: Cefepime given at 17:01  Additional action taken by pharmacy:    If necessary, Name of Provider/Nurse Contacted:      Foye Deer ,PharmD Clinical Pharmacist  07/29/2020  8:04 PM

## 2020-07-20 NOTE — Consult Note (Incomplete)
CODE SEPSIS - PHARMACY COMMUNICATION  **Broad Spectrum Antibiotics should be administered within 1 hour of Sepsis diagnosis**  Time Code Sepsis Called/Page Received: 1623  Antibiotics Ordered: Cefepime 2g IV x1  Time of 1st antibiotic administration: ***  Additional action taken by pharmacy: Called nursing station, unable to get in touch with nurse. Informed ED secretary to inform nurse of antibiotic due time.   Based on clinical overview/LDA documentation, looks like patient did not have IV access until 1716   Raiford Noble, PharmD Pharmacy Resident  07/30/2020 4:38 PM

## 2020-07-20 NOTE — Consult Note (Signed)
PHARMACY -  BRIEF ANTIBIOTIC NOTE   Pharmacy has received consult(s) for cefepime from an ED provider.  The patient's profile has been reviewed for ht/wt/allergies/indication/available labs.    One time order(s) placed for cefepime 2g IV  Further antibiotics/pharmacy consults should be ordered by admitting physician if indicated.                       Raiford Noble, PharmD Pharmacy Resident  07/22/2020 4:36 PM

## 2020-07-20 NOTE — ED Provider Notes (Signed)
[facility] Emergency Department Provider Note  ____________________________________________   Event Date/Time   First MD Initiated Contact with Patient 08/08/2020 1611     (approximate)  I have reviewed the triage vital signs and the nursing notes.   HISTORY  Chief Complaint Cough and Fever   HPI Darren Coleman is a 70 y.o. male with a past medical history of chronic hypoxic respiratory failure secondary COPD with ongoing tobacco abuse and CHF who presents for assessment approximately 3 to 4 days of worsening cough, shortness of breath, abdominal pain, nonbloody nonbilious vomiting, diarrhea, decreased appetite and general malaise.  Patient denies any chest pain, headache, earache, sore throat, rash, extremity pain, urinary symptoms or recent falls or injuries.  Denies EtOH use or illicit drug use.  No clear alleviating or aggravating factors.  No prior similar episodes.          Past Medical History:  Diagnosis Date  . Cancer ([place])   . COPD (chronic obstructive pulmonary disease) [facility])     Patient Active Problem List   Diagnosis Date Noted  . Pressure injury of skin 01/09/2019  . Closed right hip fracture ([place]) 01/03/2019  . COPD (chronic obstructive pulmonary disease) ([place]) 01/03/2019    Past Surgical History:  Procedure Laterality Date  . COLON SURGERY    . INTRAMEDULLARY (IM) NAIL INTERTROCHANTERIC Right 01/03/2019   Procedure: INTRAMEDULLARY (IM) NAIL INTERTROCHANTRIC;  Surgeon: Corky Mull, MD;  Location: [facility] ORS;  Service: Orthopedics;  Laterality: Right;    Prior to Admission medications   Medication Sig Start Date End Date Taking? Authorizing Provider  acetaminophen (TYLENOL) 500 MG tablet Take 500 mg by mouth every 6 (six) hours as needed for pain.    [provider]  albuterol (VENTOLIN HFA) 108 (90 Base) MCG/ACT inhaler Inhale 2 puffs into the lungs every 6 (six) hours as needed for wheezing. 01/08/15   [provider]  apixaban (ELIQUIS) 5 MG TABS tablet Take 1 tablet (5 mg total) by mouth 2 (two) times daily. 01/10/19   Epifanio Lesches, MD  aspirin EC 81 MG tablet Take 81 mg by mouth daily.    [provider]  atorvastatin (LIPITOR) 40 MG tablet Take 40 mg by mouth daily. 08/11/18   [provider]  docusate sodium (COLACE) 100 MG capsule Take 1 capsule (100 mg total) by mouth 2 (two) times daily. 05/21/20 05/21/21  Blake Divine, MD  ferrous sulfate 325 (65 FE) MG tablet Take 1 tablet (325 mg total) by mouth 2 (two) times daily with a meal. 01/10/19   Epifanio Lesches, MD  Fluticasone-Umeclidin-Vilant 100-62.5-25 MCG/INH AEPB Inhale 1 puff into the lungs daily. 10/19/18   [provider]  furosemide (LASIX) 40 MG tablet Take 40 mg by mouth daily. 03/13/18   [provider]  hydrocortisone (ANUSOL-HC) 25 MG suppository Place 1 suppository (25 mg total) rectally every 12 (twelve) hours. 05/21/20 05/21/21  Blake Divine, MD  methocarbamol (ROBAXIN) 500 MG tablet Take 500 mg by mouth 4 (four) times daily. 11/02/18   [provider]  metoprolol succinate (TOPROL-XL) 50 MG 24 hr tablet Take 1 tablet (50 mg total) by mouth daily. Take with or immediately following a meal. 01/11/19   Epifanio Lesches, MD  oxyCODONE (OXY IR/ROXICODONE) 5 MG immediate release tablet Take 1-2 tablets (5-10 mg total) by mouth every 4 (four) hours as needed for moderate pain (pain score 4-6). 01/06/19   Reche Dixon, PA-C  oxyCODONE-acetaminophen (PERCOCET) 5-325 MG tablet Take 1  tablet by mouth every 4 (four) hours as needed for severe pain. 05/21/20 05/21/21  Blake Divine, MD  predniSONE (DELTASONE) 10 MG tablet Take 10 mg by mouth daily. 11/23/18   [provider]  Saline (AYR SALINE NASAL DROPS) 0.65 % (Soln) SOLN Place 2 drops into both nostrils every 4 (four) hours. 01/24/18   [provider]  tadalafil (CIALIS) 5 MG tablet Take 5 mg by mouth as needed.  09/01/18   [provider]  tiZANidine (ZANAFLEX) 4 MG tablet Take 4 mg by mouth every 6 (six) hours as needed for muscle spasms. 12/29/18   [provider]  traMADol (ULTRAM) 50 MG tablet Take 1 tablet (50 mg total) by mouth every 6 (six) hours. 01/06/19   Reche Dixon, PA-C    Allergies Sulfa antibiotics  No family history on file.  Social History Social History   Tobacco Use  . Smoking status: Current Every Day Smoker    Packs/day: 0.50    Types: Cigarettes  . Smokeless tobacco: Never Used  Substance Use Topics  . Alcohol use: Yes    Comment: sporadic  . Drug use: Never    Review of Systems  Review of Systems  Constitutional: Positive for chills, fever and malaise/fatigue.  HENT: Negative for sore throat.   Eyes: Negative for pain.  Respiratory: Positive for cough and shortness of breath. Negative for stridor.   Cardiovascular: Negative for chest pain.  Gastrointestinal: Positive for abdominal pain, diarrhea, nausea and vomiting.  Genitourinary: Negative for dysuria.  Musculoskeletal: Negative for myalgias.  Skin: Negative for rash.  Neurological: Negative for seizures, loss of consciousness and headaches.  Psychiatric/Behavioral: Negative for suicidal ideas.  All other systems reviewed and are negative.     ____________________________________________   PHYSICAL EXAM:  VITAL SIGNS: ED Triage Vitals  Enc Vitals Group     BP 08/05/2020 1409 (!) 151/93     Pulse Rate 08/16/2020 1409 (!) 133     Resp 08/06/2020 1409 18     Temp 07/22/2020 1409 98.9 F (37.2 C)     Temp Source 08/13/2020 1409 Oral     SpO2 08/07/2020 1409 98 %     Weight --      Height --      Head Circumference --      Peak Flow --      Pain Score 08/05/2020 1433 9     Pain Loc --      Pain Edu? --      Excl. in [place]? --    Vitals:   08/01/2020 1630 07/19/2020 1900  BP: (!) 95/40 119/61  Pulse:  61  Resp: (!) 25 (!) 22  Temp:    SpO2: 100% 97%   Physical Exam Vitals and nursing  note reviewed.  Constitutional:      Appearance: He is well-developed and well-nourished. He is ill-appearing.  HENT:     Head: Normocephalic and atraumatic.     Right Ear: External ear normal.     Left Ear: External ear normal.     Nose: Nose normal.     Mouth/Throat:     Mouth: Mucous membranes are dry.  Eyes:     Conjunctiva/sclera: Conjunctivae normal.  Cardiovascular:     Rate and Rhythm: Regular rhythm. Tachycardia present.     Heart sounds: No murmur heard.   Pulmonary:     Breath sounds: Rhonchi present.  Abdominal:     Palpations: Abdomen is soft.     Tenderness: There is abdominal  tenderness. There is no right CVA tenderness or left CVA tenderness.  Musculoskeletal:        General: No edema.     Cervical back: Neck supple.  Skin:    General: Skin is warm and dry.     Capillary Refill: Capillary refill takes less than 2 seconds.  Neurological:     Mental Status: He is alert and oriented to person, place, and time.  Psychiatric:        Mood and Affect: Mood and affect and mood normal.      ____________________________________________   LABS (all labs ordered are listed, but only abnormal results are displayed)  Labs Reviewed  LACTIC ACID, PLASMA - Abnormal; Notable for the following components:      Result Value   Lactic Acid, Venous 2.8 (*)    All other components within normal limits  LACTIC ACID, PLASMA - Abnormal; Notable for the following components:   Lactic Acid, Venous 3.6 (*)    All other components within normal limits  COMPREHENSIVE METABOLIC PANEL - Abnormal; Notable for the following components:   Sodium 117 (*)    Potassium 3.4 (*)    Chloride 76 (*)    Glucose, Bld 124 (*)    Creatinine, Ser 0.52 (*)    Calcium 8.8 (*)    Albumin 3.4 (*)    AST 48 (*)    Total Bilirubin 2.5 (*)    All other components within normal limits  CBC WITH DIFFERENTIAL/PLATELET - Abnormal; Notable for the following components:   WBC 22.4 (*)    RBC 4.18 (*)     RDW 21.0 (*)    Platelets 124 (*)    Neutro Abs 19.8 (*)    Lymphs Abs 0.3 (*)    Monocytes Absolute 1.7 (*)    Abs Immature Granulocytes 0.45 (*)    All other components within normal limits  URINALYSIS, COMPLETE (UACMP) WITH MICROSCOPIC - Abnormal; Notable for the following components:   Color, Urine AMBER (*)    APPearance HAZY (*)    Hgb urine dipstick MODERATE (*)    Protein, ur 100 (*)    Bacteria, UA RARE (*)    All other components within normal limits  APTT - Abnormal; Notable for the following components:   aPTT 37 (*)    All other components within normal limits  OSMOLALITY - Abnormal; Notable for the following components:   Osmolality 248 (*)    All other components within normal limits  BRAIN NATRIURETIC PEPTIDE - Abnormal; Notable for the following components:   B Natriuretic Peptide 385.1 (*)    All other components within normal limits  BLOOD GAS, VENOUS - Abnormal; Notable for the following components:   pO2, Ven <31.0 (*)    Bicarbonate 30.5 (*)    Acid-Base Excess 5.0 (*)    All other components within normal limits  MAGNESIUM - Abnormal; Notable for the following components:   Magnesium 1.6 (*)    All other components within normal limits  TROPONIN I (HIGH SENSITIVITY) - Abnormal; Notable for the following components:   Troponin I (High Sensitivity) 18 (*)    All other components within normal limits  RESP PANEL BY RT-PCR (FLU A&B, COVID) ARPGX2  URINE CULTURE  CULTURE, BLOOD (ROUTINE X 2)  CULTURE, BLOOD (ROUTINE X 2)  GASTROINTESTINAL PANEL BY PCR, STOOL (REPLACES STOOL CULTURE)  C DIFFICILE QUICK SCREEN W PCR REFLEX  PROTIME-INR  SODIUM, URINE, RANDOM  PROCALCITONIN  LEGIONELLA PNEUMOPHILA SEROGP 1 UR AG  STREP PNEUMONIAE URINARY ANTIGEN  BASIC METABOLIC PANEL  TROPONIN I (HIGH SENSITIVITY)   ____________________________________________  EKG  Sinus tachycardia with ventricular rate of 136, right bundle branch block, left anterior  fascicle block, and diffuse changes consistent with ischemia throughout most of leads.  Patient is noted to have ST depression and T wave inversion in anterior leads and septal leads and a prior EKG but other changes are all new today. ____________________________________________  RADIOLOGY  ED MD interpretation: Chest x-ray without evidence of pneumothorax, large effusion, significant edema but with evidence of left upper lobe consolidation.  Patient has bilateral kidney stones noted on CT abdomen pelvis as well as some fatty liver infiltration and some bladder wall thickening as well as some chronic healing fractures.  No other acute intra-abdominal or pelvic pathology  Official radiology report(s): DG Chest 1 View  Result Date: 08/13/2020 CLINICAL DATA:  Short of breath with cough and fever EXAM: CHEST  1 VIEW COMPARISON:  01/08/2019 chest x-ray.  Chest CT 03/17/2020 FINDINGS: Interval development of left upper lobe patchy airspace disease. Eventration right hemidiaphragm unchanged. Right lung clear. No effusion or heart failure. Chronic fracture right humeral neck IMPRESSION: Interval development of left upper lobe airspace disease most likely pneumonia. Electronically Signed   By: Franchot Gallo M.D.   On: 08/04/2020 18:15   CT ABDOMEN PELVIS W CONTRAST  Result Date: 08/03/2020 CLINICAL DATA:  Acute abdominal pain. Cough, congestion, fever, and headache. EXAM: CT ABDOMEN AND PELVIS WITH CONTRAST TECHNIQUE: Multidetector CT imaging of the abdomen and pelvis was performed using the standard protocol following bolus administration of intravenous contrast. CONTRAST:  154m OMNIPAQUE IOHEXOL 300 MG/ML  SOLN COMPARISON:  05/21/2020 FINDINGS: Lower chest: Small bilateral pleural effusions with airspace infiltration in the lung bases, likely pneumonia. This may represent community-acquired pneumonia. COVID pneumonia does not typically involve pleural effusions. Hepatobiliary: Mild diffuse fatty infiltration  of the liver. No focal liver lesions. The gallbladder is distended without stone or wall thickening identified. No bile duct dilatation. Pancreas: Unremarkable. No pancreatic ductal dilatation or surrounding inflammatory changes. Spleen: Normal in size without focal abnormality. Adrenals/Urinary Tract: Multiple stones demonstrated in both kidneys. Nephrograms are symmetrical. No hydronephrosis or hydroureter. No ureteral stones are identified. Bladder wall is diffusely thickened, likely indicating cystitis versus outlet obstruction. Bladder wall thickening is new since the prior study. Stomach/Bowel: Stomach, small bowel, and colon are not abnormally distended. Scattered stool in the colon. No obvious inflammatory changes although under distention limits evaluation. The appendix is normal. Vascular/Lymphatic: Tortuous and calcified aorta. No significant lymphadenopathy. Reproductive: Prostate is unremarkable. Other: No free air or free fluid in the abdomen. Abdominal wall musculature appears intact. Musculoskeletal: Diffuse bone demineralization with multiple lumbar vertebral compression deformities. Previous internal fixation of the right hip. Old right pelvic fractures. Changes likely to represent osteoporosis. IMPRESSION: 1. Small bilateral pleural effusions with airspace infiltration in the lung bases, likely pneumonia. This may represent community-acquired pneumonia. COVID pneumonia does not typically involve pleural effusions. 2. Mild diffuse fatty infiltration of the liver. 3. Multiple nonobstructing stones in both kidneys. No hydronephrosis or hydroureter. 4. Bladder wall thickening is new since the prior study, likely indicating cystitis versus outlet obstruction. 5. Diffuse bone demineralization with multiple lumbar vertebral compression deformities. Old right pelvic fractures. Changes likely to represent osteoporosis. 6. Aortic atherosclerosis. Aortic Atherosclerosis (ICD10-I70.0). Electronically Signed    By: WLucienne CapersM.D.   On: 08/09/2020 18:44    ____________________________________________   PROCEDURES  Procedure(s) performed (including Critical Care):  .Critical  Care Performed by: Lucrezia Starch, MD Authorized by: Lucrezia Starch, MD   Critical care provider statement:    Critical care time (minutes):  45   Critical care time was exclusive of:  Separately billable procedures and treating other patients   Critical care was necessary to treat or prevent imminent or life-threatening deterioration of the following conditions:  Sepsis and metabolic crisis   Critical care was time spent personally by me on the following activities:  Discussions with consultants, evaluation of patient's response to treatment, examination of patient, ordering and performing treatments and interventions, ordering and review of laboratory studies, ordering and review of radiographic studies, pulse oximetry, re-evaluation of patient's condition, obtaining history from patient or surrogate and review of old charts     ____________________________________________   INITIAL IMPRESSION / [place] / ED COURSE      Patient presents with above to history exam for assessment after 4 days of worsening shortness of breath, cough, abdominal pain, nonbloody nonbilious vomiting, nausea, and diarrhea.  Patient is on his 2 L on arrival and is satting at 99%.  Is noted be tachycardic with a heart rate of 133 with otherwise stable vital signs.  He has bilateral rhonchi on exam and is mildly tender throughout his abdomen but is nonperitoneal.  With patient's cough and shortness of breath fracture includes but is not limited to heart failure, pneumonia, PE, anemia, sepsis, ACS, metabolic derangements, and arrhythmia.  Regard to patient's abdominal pain differential includes diverticulitis, SBO, pancreatitis, acute cholecystitis, pyelonephritis, cystitis, appendicitis, and infectious  gastroenteritis.   CMP remarkable for evidence of acute hyponatremia with NA of 117 compared to 125 2 months ago as well as hypokalemia with a K of 3.4, hypochloremia with a CL of 76 and T bili of 2.5 with unremarkable LFTs and alk phos.  CBC has WBC count of 22.4 with unremarkable hemoglobin and mild thrombocytopenia with platelets of 124.  BNP is very slightly elevated at 385.  Magnesium is 1.6.  Pro-Cal is elevated 2.58.  Troponin is just above reference at 18 which I suspect represents some very mild demand ischemia embolus patient for ACS.  Initial lactic acid is 2.8.  VBG remarkable for pH of 7.  40 without evidence of hypercarbic respiratory failure.  UA with moderate hemoglobin and 100 protein without evidence of infection.  Blood and urine cultures obtained.  Cefepime ordered and patient given 2 IV fluid boluses.  Impression is sepsis from pneumonia with possible infectious gastroenteritis contributing to significant metabolic derangements.  I will plan to admit to medicine service for further evaluation management.      ____________________________________________   FINAL CLINICAL IMPRESSION(S) / ED DIAGNOSES  Final diagnoses:  Sepsis, due to unspecified organism, unspecified whether acute organ dysfunction present (HCC)  Cough  Nausea vomiting and diarrhea  Generalized abdominal pain  Hyponatremia  Lactic acidosis  SOB (shortness of breath)    Medications  ipratropium-albuterol (DUONEB) 0.5-2.5 (3) MG/3ML nebulizer solution 9 mL (has no administration in time range)  potassium chloride SA (KLOR-CON) CR tablet 40 mEq (has no administration in time range)  ceFEPIme (MAXIPIME) 2 g in sodium chloride 0.9 % 100 mL IVPB (has no administration in time range)  magnesium sulfate IVPB 2 g 50 mL (has no administration in time range)  lactated ringers bolus 500 mL (has no administration in time range)  lactated ringers bolus 1,000 mL (has no administration in time range)  iohexol  (OMNIPAQUE) 300 MG/ML solution 100 mL (100 mLs  Intravenous Contrast Given 07/30/2020 1810)     ED Discharge Orders    None       Note:  This document was prepared using Dragon voice recognition software and may include unintentional dictation errors.   Lucrezia Starch, MD 08/12/2020 Pauline Aus

## 2020-07-20 NOTE — ED Notes (Signed)
Pt had incontinent BM in [place]. Pt brought back to triage bathroom, cleaned up by EDT Vernona Rieger and Brayton Caves

## 2020-07-20 NOTE — ED Notes (Signed)
Patient's daughter updated by this RN after receiving patient permission.

## 2020-07-20 NOTE — ED Triage Notes (Signed)
Pt in via EMS from home with c/o not feeling well for 3-4 days. EMS reports pt vomited yesterday and today. 96 % on 2L of oxygen.

## 2020-07-20 NOTE — H&P (Addendum)
[place]   PATIENT NAME: Darren Coleman    MR#:  [id number]  DATE OF BIRTH:  June 29, 1951  DATE OF ADMISSION:  07/19/2020  PRIMARY CARE PHYSICIAN: Dorita Fray, MD   REQUESTING/REFERRING PHYSICIAN: Hulan Saas, MD CHIEF COMPLAINT:   Chief Complaint  Patient presents with  . Cough  . Fever    HISTORY OF PRESENT ILLNESS:  Darren Coleman  is a 70 y.o. Caucasian male with a known history of COPD and chronic respiratory failure with ongoing tobacco abuse and CHF, who presented to the emergency room with acute onset of worsening dyspnea with associated cough productive of yellowish sputum and occasionally clear and occasional wheezing for the last 4 days.  He admitted to nonbilious and nonbloody vomiting with associated nonbloody diarrhea, diminished appetite and significant malaise.  He denied any fever or chills.  No dysuria, oliguria or hematuria or flank pain.  No chest pain or palpitations.  No bleeding diathesis.  He was vaccinated for COVID-19.  His last Pfizer vaccine dose was on 9/30 2021.  Upon presentation to the ER, blood pressure was 151/93 with a heart rate of 133 with otherwise normal vital signs.  Later heart rate was 120 respiratory rate was up to 30.  Labs revealed venous blood gas with a pH 7.42, HCO3 of 30.5.  CMP revealed hyponatremia 117 and hypochloremia of 76 with hypokalemia of 3.4 and hypomagnesemia 1.6.  BNP was 385.1 and high-sensitivity troponin I was 18.  Lactic acid was 2.8 and later 3.6 serum osmolality was 248 and procalcitonin was 2.58.  CBC showed leukocytosis of 22.4.  INR is 1.2.  UA showed specific gravity 1018 and urine sodium was 12.  COVID-19 PCR and influenza antigens came back negative.  Chest x-ray showed left upper lobe airspace disease most likely pneumonia.  Abdominal and pelvic CT scan revealed the following: 1. Small bilateral pleural effusions with airspace infiltration in the lung bases, likely pneumonia. This may  represent community-acquired pneumonia. COVID pneumonia does not typically involve pleural effusions. 2. Mild diffuse fatty infiltration of the liver. 3. Multiple nonobstructing stones in both kidneys. No hydronephrosis or hydroureter. 4. Bladder wall thickening is new since the prior study, likely indicating cystitis versus outlet obstruction. 5. Diffuse bone demineralization with multiple lumbar vertebral compression deformities. Old right pelvic fractures. Changes likely to represent osteoporosis. 6. Aortic atherosclerosis.  The patient was given IV cefepime 2 g, duo nebs, 1.5 L of IV lactated Ringer ordered, 2 g of IV magnesium sulfate and 40 mEq p.o. potassium chloride.  The patient will be admitted to progressive unit bed for further evaluation and management. PAST MEDICAL HISTORY:   Past Medical History:  Diagnosis Date  . Cancer ([place])   . COPD (chronic obstructive pulmonary disease) ([facility])   -CHF -Chronic respiratory failure -History of colon cancer  PAST SURGICAL HISTORY:   Past Surgical History:  Procedure Laterality Date  . COLON SURGERY    . INTRAMEDULLARY (IM) NAIL INTERTROCHANTERIC Right 01/03/2019   Procedure: INTRAMEDULLARY (IM) NAIL INTERTROCHANTRIC;  Surgeon: Corky Mull, MD;  Location: [facility] ORS;  Service: Orthopedics;  Laterality: Right;    SOCIAL HISTORY:   Social History   Tobacco Use  . Smoking status: Current Every Day Smoker    Packs/day: 0.50    Types: Cigarettes  . Smokeless tobacco: Never Used  Substance Use Topics  . Alcohol use: Yes    Comment: sporadic    FAMILY HISTORY:   Positive for coronary artery disease in  his mother.  DRUG ALLERGIES:   Allergies  Allergen Reactions  . Sulfa Antibiotics     REVIEW OF SYSTEMS:   ROS As per history of present illness. All pertinent systems were reviewed above. Constitutional, HEENT, cardiovascular, respiratory, GI, GU, musculoskeletal, neuro, psychiatric, endocrine, integumentary and  hematologic systems were reviewed and are otherwise negative/unremarkable except for positive findings mentioned above in the HPI.   MEDICATIONS AT HOME:   Prior to Admission medications   Medication Sig Start Date End Date Taking? Authorizing Provider  acetaminophen (TYLENOL) 500 MG tablet Take 500 mg by mouth every 6 (six) hours as needed for pain.    [provider]  albuterol (PROVENTIL) (2.5 MG/3ML) 0.083% nebulizer solution Take 2.5 mg by nebulization every 6 (six) hours as needed for wheezing or shortness of breath. 03/09/20   [provider]  albuterol (VENTOLIN HFA) 108 (90 Base) MCG/ACT inhaler Inhale 2 puffs into the lungs every 6 (six) hours as needed for wheezing. 01/08/15   [provider]  aspirin EC 81 MG tablet Take 81 mg by mouth daily.    [provider]  atorvastatin (LIPITOR) 40 MG tablet Take 40 mg by mouth daily. 08/11/18   [provider]  docusate sodium (COLACE) 100 MG capsule Take 1 capsule (100 mg total) by mouth 2 (two) times daily. 05/21/20 05/21/21  Blake Divine, MD  famotidine (PEPCID) 40 MG tablet Take 40 mg by mouth daily. 05/11/20   [provider]  Fluticasone-Umeclidin-Vilant 100-62.5-25 MCG/INH AEPB Inhale 1 puff into the lungs daily. 10/19/18   [provider]  hydrocortisone (ANUSOL-HC) 25 MG suppository Place 1 suppository (25 mg total) rectally every 12 (twelve) hours. 05/21/20 05/21/21  Blake Divine, MD  ibandronate (BONIVA) 150 MG tablet Take 150 mg by mouth every 30 (thirty) days. 02/01/20   [provider]  metoprolol succinate (TOPROL-XL) 25 MG 24 hr tablet Take 25 mg by mouth daily. 05/11/20   [provider]  naloxone (NARCAN) 4 MG/0.1ML LIQD nasal spray kit Place 4 mg into the nose once. 10/04/19   [provider]  oxyCODONE-acetaminophen (PERCOCET) 5-325 MG tablet Take 1 tablet by mouth every 4 (four) hours as needed for severe pain. 05/21/20 05/21/21  Blake Divine, MD  predniSONE (DELTASONE) 10 MG tablet Take 10 mg by mouth daily. 11/23/18   [provider]  tiZANidine (ZANAFLEX) 4 MG tablet Take 4 mg by mouth every 6 (six) hours as needed for muscle spasms. 12/29/18   [provider]      VITAL SIGNS:  Blood pressure 119/61, pulse 61, temperature 98.9 F (37.2 C), temperature source Oral, resp. rate (!) 22, SpO2 97 %.  PHYSICAL EXAMINATION:  Physical Exam  GENERAL:  70 y.o.-year-old patient lying in the bed with no acute distress.  EYES: Pupils equal, round, reactive to light and accommodation. No scleral icterus. Extraocular muscles intact.  HEENT: Head atraumatic, normocephalic. Oropharynx and nasopharynx clear.  NECK:  Supple, no jugular venous distention. No thyroid enlargement, no tenderness.  LUNGS: Diminished bibasal breath sounds with bibasal and left upper lung zone crackles. CARDIOVASCULAR: Regular rate and rhythm, S1, S2 normal. No murmurs, rubs, or gallops.  ABDOMEN: Soft, nondistended, nontender. Bowel sounds present. No organomegaly or mass.  EXTREMITIES: No pedal edema, cyanosis, or clubbing.  NEUROLOGIC: Cranial nerves II through XII are intact. Muscle strength 5/5 in all extremities. Sensation intact. Gait not checked.  PSYCHIATRIC: The patient is alert and oriented x 3.  Normal affect and good eye contact. SKIN:  No obvious rash, lesion, or ulcer.   LABORATORY PANEL:   CBC Recent Labs  Lab 08/08/2020 1419  WBC 22.4*  HGB 13.8  HCT 39.3  PLT 124*   ------------------------------------------------------------------------------------------------------------------  Chemistries  Recent Labs  Lab 08/17/2020 1419  NA 117*  K 3.4*  CL 76*  CO2 28  GLUCOSE 124*  BUN 10  CREATININE 0.52*  CALCIUM 8.8*  MG 1.6*  AST 48*  ALT 19  ALKPHOS 87  BILITOT 2.5*   ------------------------------------------------------------------------------------------------------------------  Cardiac Enzymes No  results for input(s): TROPONINI in the last 168 hours. ------------------------------------------------------------------------------------------------------------------  RADIOLOGY:  DG Chest 1 View  Result Date: 08/15/2020 CLINICAL DATA:  Short of breath with cough and fever EXAM: CHEST  1 VIEW COMPARISON:  01/08/2019 chest x-ray.  Chest CT 03/17/2020 FINDINGS: Interval development of left upper lobe patchy airspace disease. Eventration right hemidiaphragm unchanged. Right lung clear. No effusion or heart failure. Chronic fracture right humeral neck IMPRESSION: Interval development of left upper lobe airspace disease most likely pneumonia. Electronically Signed   By: Franchot Gallo M.D.   On: 07/26/2020 18:15   CT ABDOMEN PELVIS W CONTRAST  Result Date: 07/29/2020 CLINICAL DATA:  Acute abdominal pain. Cough, congestion, fever, and headache. EXAM: CT ABDOMEN AND PELVIS WITH CONTRAST TECHNIQUE: Multidetector CT imaging of the abdomen and pelvis was performed using the standard protocol following bolus administration of intravenous contrast. CONTRAST:  161m OMNIPAQUE IOHEXOL 300 MG/ML  SOLN COMPARISON:  05/21/2020 FINDINGS: Lower chest: Small bilateral pleural effusions with airspace infiltration in the lung bases, likely pneumonia. This may represent community-acquired pneumonia. COVID pneumonia does not typically involve pleural effusions. Hepatobiliary: Mild diffuse fatty infiltration of the liver. No focal liver lesions. The gallbladder is distended without stone or wall thickening identified. No bile duct dilatation. Pancreas: Unremarkable. No pancreatic ductal dilatation or surrounding inflammatory changes. Spleen: Normal in size without focal abnormality. Adrenals/Urinary Tract: Multiple stones demonstrated in both kidneys. Nephrograms are symmetrical. No hydronephrosis or hydroureter. No ureteral stones are identified. Bladder wall is diffusely thickened, likely indicating cystitis versus outlet  obstruction. Bladder wall thickening is new since the prior study. Stomach/Bowel: Stomach, small bowel, and colon are not abnormally distended. Scattered stool in the colon. No obvious inflammatory changes although under distention limits evaluation. The appendix is normal. Vascular/Lymphatic: Tortuous and calcified aorta. No significant lymphadenopathy. Reproductive: Prostate is unremarkable. Other: No free air or free fluid in the abdomen. Abdominal wall musculature appears intact. Musculoskeletal: Diffuse bone demineralization with multiple lumbar vertebral compression deformities. Previous internal fixation of the right hip. Old right pelvic fractures. Changes likely to represent osteoporosis. IMPRESSION: 1. Small bilateral pleural effusions with airspace infiltration in the lung bases, likely pneumonia. This may represent community-acquired pneumonia. COVID pneumonia does not typically involve pleural effusions. 2. Mild diffuse fatty infiltration of the liver. 3. Multiple nonobstructing stones in both kidneys. No hydronephrosis or hydroureter. 4. Bladder wall thickening is new since the prior study, likely indicating cystitis versus outlet obstruction. 5. Diffuse bone demineralization with multiple lumbar vertebral compression deformities. Old right pelvic fractures. Changes likely to represent osteoporosis. 6. Aortic atherosclerosis. Aortic Atherosclerosis (ICD10-I70.0). Electronically Signed   By: WLucienne CapersM.D.   On: 08/13/2020 18:44      IMPRESSION AND PLAN:   1.  Community-acquired pneumonia with subsequent sepsis based on significant leukocytosis, tachycardia and tachypnea and severe sepsis based on lactic acid that was up to 3.6. -The patient will be admitted to a progressive unit bed. -We will continue IV antibiotic therapy with Rocephin  and Zithromax. -Mucolytic therapy will be provided. -We will place him on bronchodilator therapy. -He will be gently hydrated with IV normal  saline -We will check sputum culture and follow blood cultures drawn in the ER.  2.  Hyponatremia and hypochloremia likely hypovolemic. -We will obtain hyponatremia work-up and gently hydrate with IV normal saline while monitoring him given elevated BNP.  3.  Hypokalemia and hypomagnesemia. -Potassium and magnesium were replaced and will be followed.  4.  COPD. -The patient will be on scheduled and as needed duo nebs. -We will hold off long-acting beta agonist.  5.  Dyslipidemia. -Continue statin therapy.  6.  Essential hypertension. -We will place him on as needed IV labetalol.  7. GERD. -We will continue H2 blocker therapy.  8.  DVT prophylaxis. -Subcutaneous Lovenox.     All the records are reviewed and case discussed with ED provider. The plan of care was discussed in details with the patient (and family). I answered all questions. The patient agreed to proceed with the above mentioned plan. Further management will depend upon hospital course.   CODE STATUS: Full code  Status is: Inpatient  Remains inpatient appropriate because:Ongoing diagnostic testing needed not appropriate for outpatient work up, Unsafe d/c plan, IV treatments appropriate due to intensity of illness or inability to take PO and Inpatient level of care appropriate due to severity of illness   Dispo: The patient is from: Home              Anticipated d/c is to: Home              Anticipated d/c date is: 3 days              Patient currently is not medically stable to d/c.   TOTAL TIME TAKING CARE OF THIS PATIENT: 60 minutes.    Christel Mormon M.D on 08/08/2020 at 7:54 PM  [facility]   From 7 PM-7 AM, contact night-coverage [email or web address]  CC: Primary care physician; Dorita Fray, MD

## 2020-07-20 NOTE — ED Triage Notes (Signed)
Pt to ED via [facility] from home for cough, congestion, fever, and headache. Pt states that he has not had any COVID contacts. Pt is on chronic O2 at 2 liters. Pt is in NAD.

## 2020-07-20 NOTE — ED Notes (Addendum)
[facility] sepsis team messaged to ensure a third lactic would be done on this patient to ensure patient's lactic has decreased.  Order placed and admitting MD notified.

## 2020-07-21 ENCOUNTER — Inpatient Hospital Stay: Payer: Medicare Other

## 2020-07-21 ENCOUNTER — Inpatient Hospital Stay: Payer: Self-pay

## 2020-07-21 ENCOUNTER — Encounter: Payer: Self-pay | Admitting: Family Medicine

## 2020-07-21 DIAGNOSIS — J9601 Acute respiratory failure with hypoxia: Secondary | ICD-10-CM | POA: Diagnosis not present

## 2020-07-21 DIAGNOSIS — J189 Pneumonia, unspecified organism: Secondary | ICD-10-CM | POA: Diagnosis not present

## 2020-07-21 DIAGNOSIS — I4891 Unspecified atrial fibrillation: Secondary | ICD-10-CM | POA: Diagnosis not present

## 2020-07-21 DIAGNOSIS — A419 Sepsis, unspecified organism: Secondary | ICD-10-CM | POA: Diagnosis not present

## 2020-07-21 DIAGNOSIS — R6521 Severe sepsis with septic shock: Secondary | ICD-10-CM | POA: Diagnosis not present

## 2020-07-21 LAB — COMPREHENSIVE METABOLIC PANEL
ALT: 15 U/L (ref 0–44)
ALT: 16 U/L (ref 0–44)
AST: 43 U/L — ABNORMAL HIGH (ref 15–41)
AST: 44 U/L — ABNORMAL HIGH (ref 15–41)
Albumin: 2 g/dL — ABNORMAL LOW (ref 3.5–5.0)
Albumin: 2.5 g/dL — ABNORMAL LOW (ref 3.5–5.0)
Alkaline Phosphatase: 55 U/L (ref 38–126)
Alkaline Phosphatase: 66 U/L (ref 38–126)
Anion gap: 11 (ref 5–15)
Anion gap: 9 (ref 5–15)
BUN: 16 mg/dL (ref 8–23)
BUN: 23 mg/dL (ref 8–23)
CO2: 20 mmol/L — ABNORMAL LOW (ref 22–32)
CO2: 28 mmol/L (ref 22–32)
Calcium: 7.3 mg/dL — ABNORMAL LOW (ref 8.9–10.3)
Calcium: 7.7 mg/dL — ABNORMAL LOW (ref 8.9–10.3)
Chloride: 87 mmol/L — ABNORMAL LOW (ref 98–111)
Chloride: 94 mmol/L — ABNORMAL LOW (ref 98–111)
Creatinine, Ser: 0.69 mg/dL (ref 0.61–1.24)
Creatinine, Ser: 0.84 mg/dL (ref 0.61–1.24)
GFR, Estimated: >60 mL/min (ref >60)
GFR, Estimated: >60 mL/min (ref >60)
Glucose, Bld: 88 mg/dL (ref 70–99)
Glucose, Bld: 96 mg/dL (ref 70–99)
Potassium: 3.3 mmol/L — ABNORMAL LOW (ref 3.5–5.1)
Potassium: 4.2 mmol/L (ref 3.5–5.1)
Sodium: 124 mmol/L — ABNORMAL LOW (ref 135–145)
Sodium: 125 mmol/L — ABNORMAL LOW (ref 135–145)
Total Bilirubin: 1.1 mg/dL (ref 0.3–1.2)
Total Bilirubin: 1.7 mg/dL — ABNORMAL HIGH (ref 0.3–1.2)
Total Protein: 4.7 g/dL — ABNORMAL LOW (ref 6.5–8.1)
Total Protein: 4.9 g/dL — ABNORMAL LOW (ref 6.5–8.1)

## 2020-07-21 LAB — HIV ANTIBODY (ROUTINE TESTING W REFLEX): HIV Screen 4th Generation wRfx: NONREACTIVE

## 2020-07-21 LAB — C DIFFICILE QUICK SCREEN W PCR REFLEX
C Diff antigen: NEGATIVE
C Diff interpretation: NOT DETECTED
C Diff toxin: NEGATIVE

## 2020-07-21 LAB — BLOOD CULTURE ID PANEL (REFLEXED) - BCID2

## 2020-07-21 LAB — GASTROINTESTINAL PANEL BY PCR, STOOL (REPLACES STOOL CULTURE)

## 2020-07-21 LAB — LACTIC ACID, PLASMA
Lactic Acid, Venous: 2.6 mmol/L (ref 0.5–1.9)
Lactic Acid, Venous: 0.8 mmol/L (ref 0.5–1.9)

## 2020-07-21 LAB — CBC
HCT: 37.3 % — ABNORMAL LOW (ref 39.0–52.0)
Hemoglobin: 12.7 g/dL — ABNORMAL LOW (ref 13.0–17.0)
MCH: 33.4 pg (ref 26.0–34.0)
MCHC: 34 g/dL (ref 30.0–36.0)
MCV: 98.2 fL (ref 80.0–100.0)
Platelets: 102 10*3/uL — ABNORMAL LOW (ref 150–400)
RBC: 3.8 MIL/uL — ABNORMAL LOW (ref 4.22–5.81)
RDW: 20.9 % — ABNORMAL HIGH (ref 11.5–15.5)
WBC: 14.2 10*3/uL — ABNORMAL HIGH (ref 4.0–10.5)
nRBC: 0 % (ref 0.0–0.2)

## 2020-07-21 LAB — STREP PNEUMONIAE URINARY ANTIGEN: Strep Pneumo Urinary Antigen: NEGATIVE

## 2020-07-21 LAB — MAGNESIUM: Magnesium: 2.2 mg/dL (ref 1.7–2.4)

## 2020-07-21 LAB — CORTISOL: Cortisol, Plasma: 21.1 ug/dL

## 2020-07-21 MED ORDER — LEVALBUTEROL HCL 0.63 MG/3ML IN NEBU: 0.6300 mg | INHALATION_SOLUTION | Freq: Four times a day (QID) | RESPIRATORY_TRACT | Status: DC | PRN

## 2020-07-21 MED ORDER — POTASSIUM CHLORIDE 10 MEQ/100ML IV SOLN
10.0000 meq | INTRAVENOUS | Status: AC
  Administered 2020-07-21 (×2): 10 meq via INTRAVENOUS
  Filled 2020-07-21 (×2): qty 100

## 2020-07-21 MED ORDER — BUDESONIDE 0.5 MG/2ML IN SUSP
0.5000 mg | Freq: Two times a day (BID) | RESPIRATORY_TRACT | Status: DC
  Administered 2020-07-22 (×3): 0.5 mg via RESPIRATORY_TRACT
  Filled 2020-07-21 (×3): qty 2

## 2020-07-21 MED ORDER — AMIODARONE LOAD VIA INFUSION
150.0000 mg | Freq: Once | INTRAVENOUS | Status: DC
  Filled 2020-07-21: qty 83.34

## 2020-07-21 MED ORDER — NOREPINEPHRINE 4 MG/250ML-% IV SOLN: 0.0000 ug/min | INTRAVENOUS | Status: DC

## 2020-07-21 MED ORDER — VASOPRESSIN 20 UNITS/100 ML INFUSION FOR SHOCK
0.0000 [IU]/min | INTRAVENOUS | Status: DC
  Administered 2020-07-21: 0.03 [IU]/min via INTRAVENOUS
  Administered 2020-07-22 (×3): 0.04 [IU]/min via INTRAVENOUS
  Filled 2020-07-21 (×4): qty 100

## 2020-07-21 MED ORDER — CHLORHEXIDINE GLUCONATE 0.12 % MT SOLN
15.0000 mL | Freq: Two times a day (BID) | OROMUCOSAL | Status: DC
  Administered 2020-07-22 (×3): 15 mL via OROMUCOSAL
  Filled 2020-07-21 (×2): qty 15

## 2020-07-21 MED ORDER — HYDROCORTISONE NA SUCCINATE PF 100 MG IJ SOLR
50.0000 mg | Freq: Four times a day (QID) | INTRAMUSCULAR | Status: DC
  Administered 2020-07-22 (×4): 50 mg via INTRAVENOUS
  Filled 2020-07-21 (×6): qty 2

## 2020-07-21 MED ORDER — MAGNESIUM SULFATE 2 GM/50ML IV SOLN
2.0000 g | Freq: Once | INTRAVENOUS | Status: AC
  Administered 2020-07-21: 2 g via INTRAVENOUS
  Filled 2020-07-21: qty 50

## 2020-07-21 MED ORDER — SODIUM CHLORIDE 0.9 % IV SOLN
250.0000 mL | INTRAVENOUS | Status: DC
  Administered 2020-07-21: 250 mL via INTRAVENOUS

## 2020-07-21 MED ORDER — AMIODARONE IV BOLUS ONLY 150 MG/100ML
INTRAVENOUS | Status: AC
  Administered 2020-07-21: 150 mg
  Filled 2020-07-21: qty 100

## 2020-07-21 MED ORDER — CHLORHEXIDINE GLUCONATE CLOTH 2 % EX PADS
6.0000 | MEDICATED_PAD | Freq: Every day | CUTANEOUS | Status: DC
  Administered 2020-07-22: 6 via TOPICAL

## 2020-07-21 MED ORDER — LEVALBUTEROL HCL 0.63 MG/3ML IN NEBU
0.6300 mg | INHALATION_SOLUTION | Freq: Four times a day (QID) | RESPIRATORY_TRACT | Status: DC
  Administered 2020-07-22 (×3): 0.63 mg via RESPIRATORY_TRACT
  Filled 2020-07-21 (×3): qty 3

## 2020-07-21 MED ORDER — METHYLPREDNISOLONE SODIUM SUCC 40 MG IJ SOLR
40.0000 mg | Freq: Two times a day (BID) | INTRAMUSCULAR | Status: DC
  Administered 2020-07-21: 40 mg via INTRAVENOUS
  Filled 2020-07-21: qty 1

## 2020-07-21 MED ORDER — PHENYLEPHRINE HCL-NACL 10-0.9 MG/250ML-% IV SOLN
25.0000 ug/min | INTRAVENOUS | Status: DC
  Administered 2020-07-21: 07:00:00 25 ug/min via INTRAVENOUS
  Administered 2020-07-21: 60 ug/min via INTRAVENOUS
  Administered 2020-07-22: 30 ug/min via INTRAVENOUS
  Administered 2020-07-22: 50 ug/min via INTRAVENOUS
  Administered 2020-07-22 (×2): 60 ug/min via INTRAVENOUS
  Administered 2020-07-22: 30 ug/min via INTRAVENOUS
  Administered 2020-07-23: 150 ug/min via INTRAVENOUS
  Filled 2020-07-21 (×11): qty 250

## 2020-07-21 MED ORDER — AMIODARONE HCL IN DEXTROSE 360-4.14 MG/200ML-% IV SOLN
60.0000 mg/h | INTRAVENOUS | Status: AC
  Administered 2020-07-21: 60 mg/h via INTRAVENOUS
  Filled 2020-07-21: qty 200

## 2020-07-21 MED ORDER — LEVALBUTEROL HCL 0.63 MG/3ML IN NEBU
0.6300 mg | INHALATION_SOLUTION | Freq: Four times a day (QID) | RESPIRATORY_TRACT | Status: DC | PRN
  Administered 2020-07-21 (×2): 0.63 mg via RESPIRATORY_TRACT
  Filled 2020-07-21 (×2): qty 3

## 2020-07-21 MED ORDER — SODIUM CHLORIDE 0.9 % IV SOLN
100.0000 mg | Freq: Two times a day (BID) | INTRAVENOUS | Status: DC
  Administered 2020-07-21 – 2020-07-22 (×4): 100 mg via INTRAVENOUS
  Filled 2020-07-21 (×5): qty 100

## 2020-07-21 MED ORDER — SODIUM CHLORIDE 0.9 % IV BOLUS
500.0000 mL | Freq: Once | INTRAVENOUS | Status: AC
  Administered 2020-07-21: 500 mL via INTRAVENOUS

## 2020-07-21 MED ORDER — MIDODRINE HCL 5 MG PO TABS
2.5000 mg | ORAL_TABLET | Freq: Three times a day (TID) | ORAL | Status: DC
Start: 2020-07-21 — End: 2020-07-23
  Filled 2020-07-21: qty 0.5
  Filled 2020-07-21: qty 1
  Filled 2020-07-21 (×2): qty 0.5

## 2020-07-21 MED ORDER — AMIODARONE IV BOLUS ONLY 150 MG/100ML: 150.0000 mg | Freq: Once | INTRAVENOUS | Status: AC

## 2020-07-21 MED ORDER — SODIUM CHLORIDE 0.9 % IV SOLN: INTRAVENOUS | Status: DC

## 2020-07-21 MED ORDER — ORAL CARE MOUTH RINSE
15.0000 mL | Freq: Two times a day (BID) | OROMUCOSAL | Status: DC
  Administered 2020-07-22 (×2): 15 mL via OROMUCOSAL

## 2020-07-21 MED ORDER — SODIUM BICARBONATE 8.4 % IV SOLN
50.0000 meq | Freq: Once | INTRAVENOUS | Status: AC
  Administered 2020-07-21: 50 meq via INTRAVENOUS
  Filled 2020-07-21: qty 50

## 2020-07-21 MED ORDER — AMIODARONE HCL IN DEXTROSE 360-4.14 MG/200ML-% IV SOLN
30.0000 mg/h | INTRAVENOUS | Status: DC
  Administered 2020-07-21 (×2): 30 mg/h via INTRAVENOUS
  Administered 2020-07-22: 60 mg/h via INTRAVENOUS
  Administered 2020-07-22: 30 mg/h via INTRAVENOUS
  Administered 2020-07-22 (×2): 60 mg/h via INTRAVENOUS
  Filled 2020-07-21 (×6): qty 200

## 2020-07-21 NOTE — Progress Notes (Signed)
[facility]Link Physician-Brief Progress Note Patient Name: Darren Coleman DOB: 1950-11-14 MRN: [id number]   Date of Service  07/21/2020  HPI/Events of Note  Patient admitted with Community acquired pneumonia, shock, and Afib with RVR.  eICU Interventions  New Patient Evaluation completed.         Thomasene Lot Herson Prichard 07/21/2020, 11:57 PM

## 2020-07-21 NOTE — Progress Notes (Signed)
Order for PICC noted. BP very low at present. Multiple vasoactive drips infusing. Patient too unstable for PICC linbe insertion at this time. [place] NP notified.

## 2020-07-21 NOTE — Progress Notes (Signed)
PROGRESS NOTE    Darren Coleman   [id number]  DOB: 02-27-1951  PCP: Dorita Fray, MD    DOA: 08/03/2020 LOS: 1   Brief Narrative   Darren Coleman  is a 70 y.o. Caucasian male with a known history of COPD and chronic respiratory failure with ongoing tobacco abuse and CHF, who presented to the ED on 08/04/2020 with worsening dyspnea and associated productive cough, and wheezing for 4 days.  Also reported poor appetite, nausea vomiting, diarrhea and general malaise but no fevers or chills.  Evaluation in the ED, tachycardic with heart rate 133, tachypneic with respiratory rate in the 30s.  Chest x-ray showed left upper lobe airspace disease consistent with pneumonia.  CT of abdomen and pelvis showed likely bibasilar pneumonia but no acute abdominal findings.  First overnight of admission, patient developed A-fib with RVR and severe hypotension requiring vasopressor.  Started on amiodarone infusion.  Level of care upgraded to stepdown unit, still on hold for a bed in the ED.       Assessment & Plan   Active Problems:   CAP (community acquired pneumonia)   Pneumonia   Septic shock secondary to Community-acquired PNA Severe Sepsis POA with tachycardia, tachypnea, leukocytosis, lactic acidosis with evidence on PNA.  Septic shock criteria with hypotension requiring pressor. Lactic acidosis resolved. --Continue Rocephin and Doxy --Continue IV fluids NS @ 125 cc/hr --Continue phenylephrine PRN to keep MAP >=65 --Consult PCCM --Awaiting stepdown/ICU bed, monitor closely --Continue supportive care with mucolytics, bronchodilators, Tylenol --Follow cultures, S pneumo and Legionella Ur Ag's  Hyponatremia - presented with Na 117.  Likely hypovolemic given clinical picture.  Checking Legionella urinary antigen.  TSH and cortisol normal.  Follow up osmolalities etc to further clarify etiology.  Monitor BMP. IV fluids as above.  Likely acute, but caution with correction rate.  A-fib  with RVR - due to sepsis / infection.  On amiodarone infusion, continue.    Hypokalemia - replacing.  Monitor BMP.  Monitor Mg.  Keep K>4.0 and Mg>2.0 in setting of Afib RVR.  COPD - continue scheduled Duonebs with PRN nebs available.  Does not seem acutely exacerbated, not wheezing on exam.  Hyperlipidemia - continue statin  Hypertension - currently hypotensive as above.  Maintain MAP>=65.  Monitor BP.  GERD - continue Pepcid    Patient BMI: There is no height or weight on file to calculate BMI.   DVT prophylaxis: enoxaparin (LOVENOX) injection 40 mg Start: 08/01/2020 2200   Diet:  Diet Orders (From admission, onward)    Start     Ordered   07/25/2020 1942  Diet Heart Room service appropriate? Yes; Fluid consistency: Thin  Diet effective now       Question Answer Comment  Room service appropriate? Yes   Fluid consistency: Thin      07/26/2020 1943            Code Status: Full Code    Subjective 07/21/20    Pt seen in ED today, holding for a bed.  Reports feeling fair, little better.  No fever/chills, chest pain, SOB.  Reports he has an upset stomach, no vomiting.   Disposition Plan & Communication   Status is: Inpatient  Inpatient status is appropriate as patient is critically ill with septic shock due to pneumonia, on pressor for BP support and IV antibiotics.  Dispo: The patient is from: Home              Anticipated d/c is to: Home  Anticipated d/c date is: >3 days              Patient currently is NOT medically stable for d/c   Family Communication: none at bedside, will attempt to call   Consults, Procedures, Significant Events   Consultants:   PCCM  Procedures:   None  Antimicrobials:  Anti-infectives (From admission, onward)   Start     Dose/Rate Route Frequency Ordered Stop   07/21/20 0900  doxycycline (VIBRAMYCIN) 100 mg in sodium chloride 0.9 % 250 mL IVPB        100 mg 125 mL/hr over 120 Minutes Intravenous Every 12 hours  07/21/20 0502     07/21/20 0700  cefTRIAXone (ROCEPHIN) 2 g in sodium chloride 0.9 % 100 mL IVPB        2 g 200 mL/hr over 30 Minutes Intravenous Every 24 hours 08/06/2020 2214 07/25/20 0659   08/11/2020 2200  cefTRIAXone (ROCEPHIN) 2 g in sodium chloride 0.9 % 100 mL IVPB  Status:  Discontinued        2 g 200 mL/hr over 30 Minutes Intravenous Every 24 hours 07/31/2020 1943 08/12/2020 2214   07/27/2020 2200  azithromycin (ZITHROMAX) 500 mg in sodium chloride 0.9 % 250 mL IVPB  Status:  Discontinued        500 mg 250 mL/hr over 60 Minutes Intravenous Every 24 hours 08/11/2020 1943 07/21/20 0502   08/01/2020 1630  ceFEPIme (MAXIPIME) 2 g in sodium chloride 0.9 % 100 mL IVPB        2 g 200 mL/hr over 30 Minutes Intravenous  Once 07/30/2020 1629 08/12/2020 1731        Objective   Vitals:   07/21/20 1715 07/21/20 1753 07/21/20 1830 07/21/20 1832  BP: (!) 80/52 (!) 73/52 (!) 78/55 (!) 87/62  Pulse:      Resp: 20 (!) 39 (!) 22 17  Temp:      TempSrc:      SpO2:        Intake/Output Summary (Last 24 hours) at 07/21/2020 1839 Last data filed at 07/21/2020 [id number] Gross per 24 hour  Intake 1300 ml  Output -  Net 1300 ml   There were no vitals filed for this visit.  Physical Exam:  General exam: awake, alert, no acute distress HEENT: moist mucus membranes, hearing grossly normal  Respiratory system: diffuse rhonchi, no wheezes, rales or rhonchi, mildly increased respiratory effort. Cardiovascular system: irregularly irregular, no pedal edema.   Gastrointestinal system: soft, NT, ND Central nervous system: A&O x3. no gross focal neurologic deficits, normal speech Skin: warm, dry, intact Psychiatry: normal mood, congruent affect, judgement and insight appear normal  Labs   Data Reviewed: I have personally reviewed following labs and imaging studies  CBC: Recent Labs  Lab 08/07/2020 1419 07/21/20 0530  WBC 22.4* 14.2*  NEUTROABS 19.8*  --   HGB 13.8 12.7*  HCT 39.3 37.3*  MCV 94.0 98.2  PLT 124*  [id number]*   Basic Metabolic Panel: Recent Labs  Lab 08/13/2020 1419 08/07/2020 1950 07/21/20 0530  NA 117* 120* 124*  K 3.4* 3.0* 3.3*  CL 76* 83* 87*  CO2 28 26 28   GLUCOSE 124* 97 96  BUN 10 14 16   CREATININE 0.52* 0.60* 0.69  CALCIUM 8.8* 7.8* 7.7*  MG 1.6*  --  2.2   GFR: CrCl cannot be calculated (Unknown ideal weight.). Liver Function Tests: Recent Labs  Lab 07/24/2020 1419 07/21/20 0530  AST 48* 43*  ALT 19 15  ALKPHOS 87  55  BILITOT 2.5* 1.7*  PROT 6.8 4.9*  ALBUMIN 3.4* 2.5*   No results for input(s): LIPASE, AMYLASE in the last 168 hours. No results for input(s): AMMONIA in the last 168 hours. Coagulation Profile: Recent Labs  Lab 08/12/2020 1647  INR 1.2   Cardiac Enzymes: No results for input(s): CKTOTAL, CKMB, CKMBINDEX, TROPONINI in the last 168 hours. BNP (last 3 results) No results for input(s): PROBNP in the last 8760 hours. HbA1C: No results for input(s): HGBA1C in the last 72 hours. CBG: No results for input(s): GLUCAP in the last 168 hours. Lipid Profile: No results for input(s): CHOL, HDL, LDLCALC, TRIG, CHOLHDL, LDLDIRECT in the last 72 hours. Thyroid Function Tests: Recent Labs    07/22/2020 1950  TSH 0.460   Anemia Panel: No results for input(s): VITAMINB12, FOLATE, FERRITIN, TIBC, IRON, RETICCTPCT in the last 72 hours. Sepsis Labs: Recent Labs  Lab 07/24/2020 1419 08/16/2020 1442 08/06/2020 1647 08/03/2020 2152 07/21/20 0530 07/21/20 0937  PROCALCITON 2.58  --   --   --   --   --   LATICACIDVEN  --    < > 3.6* 2.9* 2.6* 0.8   < > = values in this interval not displayed.    Recent Results (from the past 240 hour(s))  Blood culture (routine x 2)     Status: None (Preliminary result)   Collection Time: 07/21/2020  4:47 PM   Specimen: BLOOD  Result Value Ref Range Status   Specimen Description BLOOD  Final   Special Requests NONE  Final   Culture  Setup Time   Final    Organism ID to follow GRAM POSITIVE COCCI AEROBIC BOTTLE ONLY CRITICAL  RESULT CALLED TO, READ BACK BY AND VERIFIED WITH: JASON ROBINS AT 1825 ON 07/21/20 BY SS Performed at [facility], [address] [place], [place] [address]    Culture GRAM POSITIVE COCCI  Final   Report Status PENDING  Incomplete  Blood culture (routine x 2)     Status: None (Preliminary result)   Collection Time: 08/06/2020  4:47 PM   Specimen: BLOOD  Result Value Ref Range Status   Specimen Description BLOOD RIGHT ANTECUBITAL  Final   Special Requests   Final    BOTTLES DRAWN AEROBIC AND ANAEROBIC Blood Culture results may not be optimal due to an inadequate volume of blood received in culture bottles   Culture   Final    NO GROWTH < 24 HOURS Performed at [facility], [address], [place], [place] [address]    Report Status PENDING  Incomplete  Resp Panel by RT-PCR (Flu A&B, Covid) Nasopharyngeal Swab     Status: None   Collection Time: 07/25/2020  4:47 PM   Specimen: Nasopharyngeal Swab; Nasopharyngeal(NP) swabs in vial transport medium  Result Value Ref Range Status   SARS Coronavirus 2 by RT PCR NEGATIVE NEGATIVE Final    Comment: (NOTE) SARS-CoV-2 target nucleic acids are NOT DETECTED.  The SARS-CoV-2 RNA is generally detectable in upper respiratory specimens during the acute phase of infection. The lowest concentration of SARS-CoV-2 viral copies this assay can detect is 138 copies/mL. A negative result does not preclude SARS-Cov-2 infection and should not be used as the sole basis for treatment or other patient management decisions. A negative result may occur with  improper specimen collection/handling, submission of specimen other than nasopharyngeal swab, presence of viral mutation(s) within the areas targeted by this assay, and inadequate number of viral copies(<138 copies/mL). A negative result must  be combined with clinical observations, patient history, and epidemiological information. The expected result is Negative.  Fact Sheet for Patients:   EntrepreneurPulse.com.au  Fact Sheet for Healthcare Providers:  IncredibleEmployment.be  This test is no t yet approved or cleared by the [place] FDA and  has been authorized for detection and/or diagnosis of SARS-CoV-2 by FDA under an Emergency Use Authorization (EUA). This EUA will remain  in effect (meaning this test can be used) for the duration of the COVID-19 declaration under Section 564(b)(1) of the Act, 21 U.S.C.section 360bbb-3(b)(1), unless the authorization is terminated  or revoked sooner.       Influenza A by PCR NEGATIVE NEGATIVE Final   Influenza B by PCR NEGATIVE NEGATIVE Final    Comment: (NOTE) The Xpert Xpress SARS-CoV-2/FLU/RSV plus assay is intended as an aid in the diagnosis of influenza from Nasopharyngeal swab specimens and should not be used as a sole basis for treatment. Nasal washings and aspirates are unacceptable for Xpert Xpress SARS-CoV-2/FLU/RSV testing.  Fact Sheet for Patients: EntrepreneurPulse.com.au  Fact Sheet for Healthcare Providers: IncredibleEmployment.be  This test is not yet approved or cleared by the [place] FDA and has been authorized for detection and/or diagnosis of SARS-CoV-2 by FDA under an Emergency Use Authorization (EUA). This EUA will remain in effect (meaning this test can be used) for the duration of the COVID-19 declaration under Section 564(b)(1) of the Act, 21 U.S.C. section 360bbb-3(b)(1), unless the authorization is terminated or revoked.  Performed at [facility], [address] [place], [place] [address]   Blood Culture ID Panel (Reflexed)     Status: Abnormal   Collection Time: 08/17/2020  4:47 PM  Result Value Ref Range Status   Enterococcus faecalis NOT DETECTED NOT DETECTED Final   Enterococcus Faecium NOT DETECTED NOT DETECTED Final   Listeria monocytogenes NOT DETECTED NOT DETECTED Final   Staphylococcus  species DETECTED (A) NOT DETECTED Final    Comment: CRITICAL RESULT CALLED TO, READ BACK BY AND VERIFIED WITH: JASON ROBINS AT 1825 ON 07/21/20 BY SS    Staphylococcus aureus (BCID) NOT DETECTED NOT DETECTED Final   Staphylococcus epidermidis NOT DETECTED NOT DETECTED Final   Staphylococcus lugdunensis NOT DETECTED NOT DETECTED Final   Streptococcus species NOT DETECTED NOT DETECTED Final   Streptococcus agalactiae NOT DETECTED NOT DETECTED Final   Streptococcus pneumoniae NOT DETECTED NOT DETECTED Final   Streptococcus pyogenes NOT DETECTED NOT DETECTED Final   A.calcoaceticus-baumannii NOT DETECTED NOT DETECTED Final   Bacteroides fragilis NOT DETECTED NOT DETECTED Final   Enterobacterales NOT DETECTED NOT DETECTED Final   Enterobacter cloacae complex NOT DETECTED NOT DETECTED Final   Escherichia coli NOT DETECTED NOT DETECTED Final   Klebsiella aerogenes NOT DETECTED NOT DETECTED Final   Klebsiella oxytoca NOT DETECTED NOT DETECTED Final   Klebsiella pneumoniae NOT DETECTED NOT DETECTED Final   Proteus species NOT DETECTED NOT DETECTED Final   Salmonella species NOT DETECTED NOT DETECTED Final   Serratia marcescens NOT DETECTED NOT DETECTED Final   Haemophilus influenzae NOT DETECTED NOT DETECTED Final   Neisseria meningitidis NOT DETECTED NOT DETECTED Final   Pseudomonas aeruginosa NOT DETECTED NOT DETECTED Final   Stenotrophomonas maltophilia NOT DETECTED NOT DETECTED Final   Candida albicans NOT DETECTED NOT DETECTED Final   Candida auris NOT DETECTED NOT DETECTED Final   Candida glabrata NOT DETECTED NOT DETECTED Final   Candida krusei NOT DETECTED NOT DETECTED Final   Candida parapsilosis NOT DETECTED NOT DETECTED Final   Candida tropicalis  NOT DETECTED NOT DETECTED Final   Cryptococcus neoformans/gattii NOT DETECTED NOT DETECTED Final    Comment: Performed at [facility], [address] [place], [place] [address]  Culture, sputum-assessment     Status: None    Collection Time: 07/21/2020  7:50 PM   Specimen: Expectorated Sputum  Result Value Ref Range Status   Specimen Description EXPSU  Final   Special Requests NONE  Final   Sputum evaluation   Final    THIS SPECIMEN IS ACCEPTABLE FOR SPUTUM CULTURE Performed at [facility], [address], [place], [place] [address]    Report Status 08/03/2020 FINAL  Final  Culture, respiratory     Status: None (Preliminary result)   Collection Time: 08/18/2020  7:50 PM  Result Value Ref Range Status   Specimen Description   Final    EXPSU Performed at [facility], [address], [place], [place] [address]    Special Requests   Final    NONE Reflexed from [id number] Performed at [facility], [address] [place], [place] [address]    Gram Stain   Final    RARE WBC PRESENT, PREDOMINANTLY PMN ABUNDANT GRAM POSITIVE COCCI IN CHAINS IN CLUSTERS    Culture   Final    TOO YOUNG TO READ Performed at [facility], [place] [address], [place], [place] [address]    Report Status PENDING  Incomplete  Gastrointestinal Panel by PCR , Stool     Status: None   Collection Time: 07/21/20  9:42 AM   Specimen: Stool  Result Value Ref Range Status   Campylobacter species NOT DETECTED NOT DETECTED Final   Plesimonas shigelloides NOT DETECTED NOT DETECTED Final   Salmonella species NOT DETECTED NOT DETECTED Final   Yersinia enterocolitica NOT DETECTED NOT DETECTED Final   Vibrio species NOT DETECTED NOT DETECTED Final   Vibrio cholerae NOT DETECTED NOT DETECTED Final   Enteroaggregative E coli (EAEC) NOT DETECTED NOT DETECTED Final   Enteropathogenic E coli (EPEC) NOT DETECTED NOT DETECTED Final   Enterotoxigenic E coli (ETEC) NOT DETECTED NOT DETECTED Final   Shiga like toxin producing E coli (STEC) NOT DETECTED NOT DETECTED Final   Shigella/Enteroinvasive E coli (EIEC) NOT DETECTED NOT DETECTED Final   Cryptosporidium NOT DETECTED NOT DETECTED Final   Cyclospora cayetanensis NOT  DETECTED NOT DETECTED Final   Entamoeba histolytica NOT DETECTED NOT DETECTED Final   Giardia lamblia NOT DETECTED NOT DETECTED Final   Adenovirus F40/41 NOT DETECTED NOT DETECTED Final   Astrovirus NOT DETECTED NOT DETECTED Final   Norovirus GI/GII NOT DETECTED NOT DETECTED Final   Rotavirus A NOT DETECTED NOT DETECTED Final   Sapovirus (I, II, IV, and V) NOT DETECTED NOT DETECTED Final    Comment: Performed at [facility], [address] [place], [place] [address]  C Difficile Quick Screen w PCR reflex     Status: None   Collection Time: 07/21/20  9:42 AM  Result Value Ref Range Status   C Diff antigen NEGATIVE NEGATIVE Final   C Diff toxin NEGATIVE NEGATIVE Final   C Diff interpretation No C. difficile detected.  Final    Comment: Performed at [facility], [address] [place], [place] [address]      Imaging Studies   DG Chest 1 View  Result Date: 07/21/2020 CLINICAL DATA:  Tachycardia EXAM: CHEST  1 VIEW COMPARISON:  Yesterday FINDINGS: Airspace disease with hazy opacity at both bases and airspace disease  in the left suprahilar lung. There is background emphysema. Stable heart size and mediastinal contours distorted by rotation and aortic elongation. IMPRESSION: Bilateral pneumonia with worsening opacity at the bases. Electronically Signed   By: Monte Fantasia M.D.   On: 07/21/2020 05:27   DG Chest 1 View  Result Date: 08/17/2020 CLINICAL DATA:  Short of breath with cough and fever EXAM: CHEST  1 VIEW COMPARISON:  01/08/2019 chest x-ray.  Chest CT 03/17/2020 FINDINGS: Interval development of left upper lobe patchy airspace disease. Eventration right hemidiaphragm unchanged. Right lung clear. No effusion or heart failure. Chronic fracture right humeral neck IMPRESSION: Interval development of left upper lobe airspace disease most likely pneumonia. Electronically Signed   By: Franchot Gallo M.D.   On: 07/29/2020 18:15   CT ABDOMEN PELVIS W CONTRAST  Result  Date: 07/25/2020 CLINICAL DATA:  Acute abdominal pain. Cough, congestion, fever, and headache. EXAM: CT ABDOMEN AND PELVIS WITH CONTRAST TECHNIQUE: Multidetector CT imaging of the abdomen and pelvis was performed using the standard protocol following bolus administration of intravenous contrast. CONTRAST:  131mL OMNIPAQUE IOHEXOL 300 MG/ML  SOLN COMPARISON:  05/21/2020 FINDINGS: Lower chest: Small bilateral pleural effusions with airspace infiltration in the lung bases, likely pneumonia. This may represent community-acquired pneumonia. COVID pneumonia does not typically involve pleural effusions. Hepatobiliary: Mild diffuse fatty infiltration of the liver. No focal liver lesions. The gallbladder is distended without stone or wall thickening identified. No bile duct dilatation. Pancreas: Unremarkable. No pancreatic ductal dilatation or surrounding inflammatory changes. Spleen: Normal in size without focal abnormality. Adrenals/Urinary Tract: Multiple stones demonstrated in both kidneys. Nephrograms are symmetrical. No hydronephrosis or hydroureter. No ureteral stones are identified. Bladder wall is diffusely thickened, likely indicating cystitis versus outlet obstruction. Bladder wall thickening is new since the prior study. Stomach/Bowel: Stomach, small bowel, and colon are not abnormally distended. Scattered stool in the colon. No obvious inflammatory changes although under distention limits evaluation. The appendix is normal. Vascular/Lymphatic: Tortuous and calcified aorta. No significant lymphadenopathy. Reproductive: Prostate is unremarkable. Other: No free air or free fluid in the abdomen. Abdominal wall musculature appears intact. Musculoskeletal: Diffuse bone demineralization with multiple lumbar vertebral compression deformities. Previous internal fixation of the right hip. Old right pelvic fractures. Changes likely to represent osteoporosis. IMPRESSION: 1. Small bilateral pleural effusions with airspace  infiltration in the lung bases, likely pneumonia. This may represent community-acquired pneumonia. COVID pneumonia does not typically involve pleural effusions. 2. Mild diffuse fatty infiltration of the liver. 3. Multiple nonobstructing stones in both kidneys. No hydronephrosis or hydroureter. 4. Bladder wall thickening is new since the prior study, likely indicating cystitis versus outlet obstruction. 5. Diffuse bone demineralization with multiple lumbar vertebral compression deformities. Old right pelvic fractures. Changes likely to represent osteoporosis. 6. Aortic atherosclerosis. Aortic Atherosclerosis (ICD10-I70.0). Electronically Signed   By: Lucienne Capers M.D.   On: 07/24/2020 18:44     Medications   Scheduled Meds: . amiodarone  150 mg Intravenous Once  . aspirin EC  81 mg Oral Daily  . dextromethorphan-guaiFENesin  1 tablet Oral BID  . docusate sodium  100 mg Oral BID  . enoxaparin (LOVENOX) injection  40 mg Subcutaneous Q24H  . famotidine  40 mg Oral Daily  . guaiFENesin  600 mg Oral BID  . methylPREDNISolone (SOLU-MEDROL) injection  40 mg Intravenous Q12H   Continuous Infusions: . sodium chloride 125 mL/hr at 07/21/20 0650  . sodium chloride Stopped (07/21/20 0914)  . sodium chloride Stopped (07/21/20 0913)  . amiodarone 30 mg/hr (07/21/20  1143)  . cefTRIAXone (ROCEPHIN)  IV Stopped (07/21/20 6629)  . doxycycline (VIBRAMYCIN) IV Stopped (07/21/20 1142)  . phenylephrine (NEO-SYNEPHRINE) Adult infusion 45 mcg/min (07/21/20 1755)       LOS: 1 day    Time spent: 30 minutes    Pennie Banter, DO [facility]  07/21/2020, 6:39 PM    If 7PM-7AM, please contact night-coverage. How to contact the [facility] Attending or Consulting provider 7A - 7P or covering provider during after hours 7P -7A, for this patient?    1. Check the care team in [facility] and look for a) attending/consulting TRH provider listed and b) the [facility] team listed 2. Log into [email or web address] and use [facility]'s universal password to access. If you do not have the password, please contact the hospital operator. 3. Locate the [facility] provider you are looking for under [facility] and page to a number that you can be directly reached. 4. If you still have difficulty reaching the provider, please page the [facility] (Director on Call) for the Hospitalists listed on amion for assistance.

## 2020-07-21 NOTE — Progress Notes (Signed)
Pt not following commands/opens eyes spontaneously. Stat ABG obtained revealed severe respiratory acidosis with acid-base deficit of 9.4.  Pt remains hypotensive on peripheral neo-synephrine and vasopressin gtts.  Pt placed on Bipap and 1 amp of sodium bicarb administered.  Once pt stabilized will obtain a stat CT Head to r/o CVA.  Repeat ABG @02 :30 am.  Will continue to monitor and assess pt.  , AGNP  Pulmonary/Critical Care Pager [phone] (please enter 7 digits) PCCM Consult Pager [phone] (please enter 7 digits)

## 2020-07-21 NOTE — Progress Notes (Signed)
[facility] note  PATIENT NAME: Darren Coleman    MR#:  [id number]  DATE OF BIRTH:  11/19/1950  DATE OF ADMISSION:  08/08/2020  PRIMARY CARE PHYSICIAN: Dorita Fray, MD    CHIEF COMPLAINT:   Chief Complaint  Patient presents with  . Cough  . Fever    HISTORY OF PRESENT ILLNESS:  Darren Coleman  is a 70 y.o. male with a known history of COPD and chronic respiratory failure with ongoing tobacco abuse and CHF, who was admitted last night for community-acquired pneumonia with subsequent sepsis and severe sepsis, hyponatremia and hypochloremia, hypokalemia and hypomagnesemia.  He was initially placed on IV Rocephin and Zithromax and later Zithromax was switched to doxycycline as well as bronchodilator therapy and hydration with IV normal saline.  He became mildly hypotensive and went into atrial fibrillation with rapid ventricular response.  He was started on IV amiodarone earlier this morning and was given a bolus of IV normal saline.  He continued to be hypotensive and therefore was started on IV Neo-Synephrine.  His map was up to 81.  I was later called about him this evening as his map is dropped to 62.  His pulse oximetry was down to 88% on high flow nasal cannula.  He denied any chest pain or palpitations.  PAST MEDICAL HISTORY:   Past Medical History:  Diagnosis Date  . Cancer ([place])   . COPD (chronic obstructive pulmonary disease) ([facility])   -CHF -Chronic respiratory failure -History of colon cancer  PAST SURGICAL HISTORY:   Past Surgical History:  Procedure Laterality Date  . COLON SURGERY    . INTRAMEDULLARY (IM) NAIL INTERTROCHANTERIC Right 01/03/2019   Procedure: INTRAMEDULLARY (IM) NAIL INTERTROCHANTRIC;  Surgeon: Corky Mull, MD;  Location: [facility] ORS;  Service: Orthopedics;  Laterality: Right;    SOCIAL HISTORY:   Social History   Tobacco Use  . Smoking status: Current Every Day Smoker    Packs/day: 0.50    Types: Cigarettes  . Smokeless  tobacco: Never Used  Substance Use Topics  . Alcohol use: Yes    Comment: sporadic    FAMILY HISTORY:  Positive for coronary artery disease in his mother.  DRUG ALLERGIES:   Allergies  Allergen Reactions  . Sulfa Antibiotics Rash    REVIEW OF SYSTEMS:   ROS As per history of present illness. All pertinent systems were reviewed above. Constitutional, HEENT, cardiovascular, respiratory, GI, GU, musculoskeletal, neuro, psychiatric, endocrine, integumentary and hematologic systems were reviewed and are otherwise negative/unremarkable except for positive findings mentioned above in the HPI.   MEDICATIONS AT HOME:   Prior to Admission medications   Medication Sig Start Date End Date Taking? Authorizing Provider  aspirin EC 81 MG tablet Take 81 mg by mouth daily.   Yes [provider]  famotidine (PEPCID) 40 MG tablet Take 40 mg by mouth daily. 05/11/20  Yes [provider]  Fluticasone-Umeclidin-Vilant 100-62.5-25 MCG/INH AEPB Inhale 1 puff into the lungs daily. 10/19/18  Yes [provider]  oxyCODONE-acetaminophen (PERCOCET) 5-325 MG tablet Take 1 tablet by mouth every 4 (four) hours as needed for severe pain. 05/21/20 05/21/21 Yes Blake Divine, MD  tiZANidine (ZANAFLEX) 4 MG tablet Take 4 mg by mouth every 6 (six) hours as needed for muscle spasms. 12/29/18  Yes [provider]  acetaminophen (TYLENOL) 500 MG tablet Take 500 mg by mouth every 6 (six) hours as needed for pain.    [provider]  albuterol (PROVENTIL) (  2.5 MG/3ML) 0.083% nebulizer solution Take 2.5 mg by nebulization every 6 (six) hours as needed for wheezing or shortness of breath. 03/09/20   [provider]  albuterol (VENTOLIN HFA) 108 (90 Base) MCG/ACT inhaler Inhale 2 puffs into the lungs every 6 (six) hours as needed for wheezing. 01/08/15   [provider]  atorvastatin (LIPITOR) 40 MG tablet Take 40 mg by mouth daily. Patient not taking: Reported on  07/31/2020 08/11/18   [provider]  docusate sodium (COLACE) 100 MG capsule Take 1 capsule (100 mg total) by mouth 2 (two) times daily. 05/21/20 05/21/21  Blake Divine, MD  hydrocortisone (ANUSOL-HC) 25 MG suppository Place 1 suppository (25 mg total) rectally every 12 (twelve) hours. 05/21/20 05/21/21  Blake Divine, MD  ibandronate (BONIVA) 150 MG tablet Take 150 mg by mouth every 30 (thirty) days. Patient not taking: Reported on 08/09/2020 02/01/20   [provider]  metoprolol succinate (TOPROL-XL) 25 MG 24 hr tablet Take 25 mg by mouth daily. Patient not taking: Reported on 08/04/2020 05/11/20   [provider]  naloxone [facility]) 4 MG/0.1ML LIQD nasal spray kit Place 4 mg into the nose once. 10/04/19   [provider]  predniSONE (DELTASONE) 10 MG tablet Take 10 mg by mouth daily. Patient not taking: Reported on 08/09/2020 11/23/18   [provider]      VITAL SIGNS:  Blood pressure (!) 70/40, pulse 98, temperature 98.8 F (37.1 C), temperature source Oral, resp. rate (!) 23, SpO2 99 %.  PHYSICAL EXAMINATION:  Physical Exam  GENERAL:  70 y.o.-year-old acutely ill Caucasian male patient lying in the bed with moderate respiratory distress with conversational dyspnea on her percent nonrebreather. EYES: Pupils equal, round, reactive to light and accommodation. No scleral icterus. Extraocular muscles intact.  HEENT: Head atraumatic, normocephalic. Oropharynx and nasopharynx clear.  NECK:  Supple, no jugular venous distention. No thyroid enlargement, no tenderness.  LUNGS: Diminished bibasal breath sounds with bibasal and left upper lung zone crackles and diffusely coarse breath sounds. CARDIOVASCULAR: Regular rate and rhythm, S1, S2 normal. No murmurs, rubs, or gallops.  ABDOMEN: Soft, nondistended, nontender. Bowel sounds present. No organomegaly or mass.  EXTREMITIES: No pedal edema, cyanosis, or clubbing.  NEUROLOGIC: Cranial nerves II through XII  are intact. Muscle strength 5/5 in all extremities. Sensation intact. Gait not checked.  PSYCHIATRIC: The patient is mildly lethargic.  It would still respond to questions appropriately. SKIN: No obvious rash, lesion, or ulcer.   LABORATORY PANEL:   CBC Recent Labs  Lab 07/21/20 0530  WBC 14.2*  HGB 12.7*  HCT 37.3*  PLT 102*   ------------------------------------------------------------------------------------------------------------------  Chemistries  Recent Labs  Lab 07/21/20 0530  NA 124*  K 3.3*  CL 87*  CO2 28  GLUCOSE 96  BUN 16  CREATININE 0.69  CALCIUM 7.7*  MG 2.2  AST 43*  ALT 15  ALKPHOS 55  BILITOT 1.7*   ------------------------------------------------------------------------------------------------------------------  Cardiac Enzymes No results for input(s): TROPONINI in the last 168 hours. ------------------------------------------------------------------------------------------------------------------  RADIOLOGY:  DG Chest 1 View  Result Date: 07/21/2020 CLINICAL DATA:  Tachycardia EXAM: CHEST  1 VIEW COMPARISON:  Yesterday FINDINGS: Airspace disease with hazy opacity at both bases and airspace disease in the left suprahilar lung. There is background emphysema. Stable heart size and mediastinal contours distorted by rotation and aortic elongation. IMPRESSION: Bilateral pneumonia with worsening opacity at the bases. Electronically Signed   By: Monte Fantasia M.D.   On: 07/21/2020 05:27   DG Chest 1 View  Result Date: 08/03/2020 CLINICAL DATA:  Short of breath with cough and fever EXAM: CHEST  1 VIEW COMPARISON:  01/08/2019 chest x-ray.  Chest CT 03/17/2020 FINDINGS: Interval development of left upper lobe patchy airspace disease. Eventration right hemidiaphragm unchanged. Right lung clear. No effusion or heart failure. Chronic fracture right humeral neck IMPRESSION: Interval development of left upper lobe airspace disease most likely pneumonia.  Electronically Signed   By: Franchot Gallo M.D.   On: 07/24/2020 18:15   CT ABDOMEN PELVIS W CONTRAST  Result Date: 07/19/2020 CLINICAL DATA:  Acute abdominal pain. Cough, congestion, fever, and headache. EXAM: CT ABDOMEN AND PELVIS WITH CONTRAST TECHNIQUE: Multidetector CT imaging of the abdomen and pelvis was performed using the standard protocol following bolus administration of intravenous contrast. CONTRAST:  153m OMNIPAQUE IOHEXOL 300 MG/ML  SOLN COMPARISON:  05/21/2020 FINDINGS: Lower chest: Small bilateral pleural effusions with airspace infiltration in the lung bases, likely pneumonia. This may represent community-acquired pneumonia. COVID pneumonia does not typically involve pleural effusions. Hepatobiliary: Mild diffuse fatty infiltration of the liver. No focal liver lesions. The gallbladder is distended without stone or wall thickening identified. No bile duct dilatation. Pancreas: Unremarkable. No pancreatic ductal dilatation or surrounding inflammatory changes. Spleen: Normal in size without focal abnormality. Adrenals/Urinary Tract: Multiple stones demonstrated in both kidneys. Nephrograms are symmetrical. No hydronephrosis or hydroureter. No ureteral stones are identified. Bladder wall is diffusely thickened, likely indicating cystitis versus outlet obstruction. Bladder wall thickening is new since the prior study. Stomach/Bowel: Stomach, small bowel, and colon are not abnormally distended. Scattered stool in the colon. No obvious inflammatory changes although under distention limits evaluation. The appendix is normal. Vascular/Lymphatic: Tortuous and calcified aorta. No significant lymphadenopathy. Reproductive: Prostate is unremarkable. Other: No free air or free fluid in the abdomen. Abdominal wall musculature appears intact. Musculoskeletal: Diffuse bone demineralization with multiple lumbar vertebral compression deformities. Previous internal fixation of the right hip. Old right pelvic  fractures. Changes likely to represent osteoporosis. IMPRESSION: 1. Small bilateral pleural effusions with airspace infiltration in the lung bases, likely pneumonia. This may represent community-acquired pneumonia. COVID pneumonia does not typically involve pleural effusions. 2. Mild diffuse fatty infiltration of the liver. 3. Multiple nonobstructing stones in both kidneys. No hydronephrosis or hydroureter. 4. Bladder wall thickening is new since the prior study, likely indicating cystitis versus outlet obstruction. 5. Diffuse bone demineralization with multiple lumbar vertebral compression deformities. Old right pelvic fractures. Changes likely to represent osteoporosis. 6. Aortic atherosclerosis. Aortic Atherosclerosis (ICD10-I70.0). Electronically Signed   By: WLucienne CapersM.D.   On: 08/13/2020 18:44      IMPRESSION AND PLAN:   1.  Septic shock secondary to community-acquired likely bacterial pneumonia. -I ordered n IV Levophed and titrate for MAP more than 65. -IV Neo-Synephrine can be later tapered off as tolerated. -We will continue other current plan of care with IV antibiotics. -I discussed the case with Dr. ALanney Gins-The the patient's care will be currently transferred to intensivist care.  2.  Acute respiratory failure with worsening hypoxia. -I ordered BiPAP.  3.  Atrial fibrillation with rapid ventricular response. -We will continue IV amiodarone drip.  Other plan of care will be continued.   All the records are reviewed and case discussed with ED provider. The plan of care was discussed in details with the patient (and family). I answered all questions. The patient agreed to proceed with the above mentioned plan. Further management will depend upon hospital course.   CODE STATUS: Full code  Status is:  Inpatient  Authorized and performed by: Eugenie Norrie, MD Total critical care time: Approximately: 35  minutes. Due to a high probability of clinically significant,  life-threatening deterioration, the patient required my highest level of preparedness to intervene emergently and I personally spent this critical care time directly and personally managing the patient.  This critical care time included obtaining a history, examining the patient, pulse oximetry, ordering and review of studies, arranging urgent treatment with development of management plan, evaluation of patient's response to treatment, frequent reassessment, and discussions with other providers. This critical care time was performed to assess and manage the high probability of imminent, life-threatening deterioration that could result in multiorgan failure.  It was exclusive of separately billable procedures and treating other patients and teaching time.  Please see MDM section and the rest of the note for further information on patient assessment and treatment.      TOTAL CRITICAL CARE TIME TAKING CARE OF THIS PATIENT:35 minutes.    Christel Mormon M.D on 07/21/2020 at 8:33 PM  [facility]   From 7 PM-7 AM, contact night-coverage [email or web address]  CC: Primary care physician; Dorita Fray, MD

## 2020-07-21 NOTE — Progress Notes (Signed)
PHARMACY - PHYSICIAN COMMUNICATION CRITICAL VALUE ALERT - BLOOD CULTURE IDENTIFICATION (BCID)  Darren Coleman is an 70 y.o. male who presented to [facility] on 08/17/20 with a chief complaint of CHF exacerbation, PNA.   Assessment:  Staph species in 1 of 4 bottles (include suspected source if known)  Name of physician (or Provider) Contacted: Denton Lank   Current antibiotics: ceftriaxone , doxycycline   Changes to prescribed antibiotics recommended:  Patient is on recommended antibiotics - No changes needed  Results for orders placed or performed during the hospital encounter of 08/17/20  Blood Culture ID Panel (Reflexed) (Collected: 08-17-2020  4:47 PM)  Result Value Ref Range   Enterococcus faecalis NOT DETECTED NOT DETECTED   Enterococcus Faecium NOT DETECTED NOT DETECTED   Listeria monocytogenes NOT DETECTED NOT DETECTED   Staphylococcus species DETECTED (A) NOT DETECTED   Staphylococcus aureus (BCID) NOT DETECTED NOT DETECTED   Staphylococcus epidermidis NOT DETECTED NOT DETECTED   Staphylococcus lugdunensis NOT DETECTED NOT DETECTED   Streptococcus species NOT DETECTED NOT DETECTED   Streptococcus agalactiae NOT DETECTED NOT DETECTED   Streptococcus pneumoniae NOT DETECTED NOT DETECTED   Streptococcus pyogenes NOT DETECTED NOT DETECTED   A.calcoaceticus-baumannii NOT DETECTED NOT DETECTED   Bacteroides fragilis NOT DETECTED NOT DETECTED   Enterobacterales NOT DETECTED NOT DETECTED   Enterobacter cloacae complex NOT DETECTED NOT DETECTED   Escherichia coli NOT DETECTED NOT DETECTED   Klebsiella aerogenes NOT DETECTED NOT DETECTED   Klebsiella oxytoca NOT DETECTED NOT DETECTED   Klebsiella pneumoniae NOT DETECTED NOT DETECTED   Proteus species NOT DETECTED NOT DETECTED   Salmonella species NOT DETECTED NOT DETECTED   Serratia marcescens NOT DETECTED NOT DETECTED   Haemophilus influenzae NOT DETECTED NOT DETECTED   Neisseria meningitidis NOT DETECTED NOT DETECTED    Pseudomonas aeruginosa NOT DETECTED NOT DETECTED   Stenotrophomonas maltophilia NOT DETECTED NOT DETECTED   Candida albicans NOT DETECTED NOT DETECTED   Candida auris NOT DETECTED NOT DETECTED   Candida glabrata NOT DETECTED NOT DETECTED   Candida krusei NOT DETECTED NOT DETECTED   Candida parapsilosis NOT DETECTED NOT DETECTED   Candida tropicalis NOT DETECTED NOT DETECTED   Cryptococcus neoformans/gattii NOT DETECTED NOT DETECTED    [name] D 07/21/2020  6:52 PM

## 2020-07-21 NOTE — Progress Notes (Addendum)
Cross Cover Patient developed Afib with RVR and hypotension. Given 500 NS started amio infusion after bolus Patient endoses paroxysmal afib with rvr induced in some situations with last occurrence about a year ago with a coloscopy Also enforses history of COPD with signs of exacerbation, started on solumedrol  Chest xray IMPRESSION: Bilateral pneumonia with worsening opacity at the bases. lactic acid down 2.9 2.6 Additional mag and k ordered prior to blood draw  Sodium improved to 124 from 120 Patient remains hypotensive with improved heart rate and 500 cc bolus, neo started orders for stepdown

## 2020-07-21 NOTE — ED Notes (Signed)
Pt with large loose BM, Saturated catheter. Pt cleansed, bedding changed, repositioned, new blankets provided.   Diffiuclty obtaining good pleth on pulse ox, sat appears to be within 86-90 range. Pt states he feels like he is not breathing well. Pt placed on humidified nasal cannula at 8L, prn nebulizer administered. States improved breathing

## 2020-07-21 NOTE — Progress Notes (Signed)
Attempted to update pts daughter Malachy Mood via telephone, however she did not answer the phone.  Therefore, left voicemail message instructing Mrs. Darren Coleman to return my phone call.    Sonda Rumble, AGNP  Pulmonary/Critical Care Pager [phone] (please enter 7 digits) PCCM Consult Pager [phone] (please enter 7 digits)

## 2020-07-21 NOTE — ED Notes (Signed)
Lab unable to draw blood.  Staying with patient until BP and MAP improve, will continue to monitor

## 2020-07-21 NOTE — ED Notes (Signed)
PICC RN reported patient is not a good candidate for a PICC line

## 2020-07-21 NOTE — ED Notes (Signed)
Patient responds to his name and will answer a simple question.

## 2020-07-21 NOTE — Consult Note (Addendum)
NAME:  Darren Coleman, MRN:  [id number], DOB:  1950-09-29, LOS: 1 ADMISSION DATE:  08/01/2020, CONSULTATION DATE: 07/21/2020 REFERRING MD: Dr. Sidney Ace, CHIEF COMPLAINT: Hypotension  Brief History:  70 yo male admitted with nausea/vomiting/diarrhea and acute on chronic hypoxic respiratory failure secondary to CAP developed atrial fibrillation with rvr along with hypotension requiring amiodarone gtt and vasopressors  History of Present Illness:  This is a 70 yo male who presented to [facility] ER on 01/2 from home via EMS with c/o worsening cough, shortness of breath, abdominal pain, n/v, and diarrhea.  Upon arrival to the ER pts O2 sats were 99% on his chronic home O2 _0  via nasal canula, however he was noted to be tachycardic with hr 133.  Lab results revealed Na+ 117, K+ 3.4, chloride 76, calcium 7.7, AST 48, lactic acid 2.8, pct 2.58, wbc 22.4, platelets 124, UA negative for UTI, and vbg pH 7.42/pCO2 46/bicarb 30.5.  EKG revealed sinus tachycardia with a right BBB. COVID-19/Influenza PCR negative, however CXR concerning for pneumonia.  Sepsis protocol initiated pt received 1.5L LR bolus, azithromycin, and ceftriaxone.  He was subsequently admitted to the progressive care unit per hospitalist team for additional workup and treatment, but remained in the ER pending bed availability. On 01/3 the pt developed atrial fibrillation with rvr along with hypotension.  He received 500 ml bolus and amiodarone bolus followed by continuous infusion.  Following interventions pts hr improved, however he remained hypotensive requiring neo-synephrine gtt and transfer to the stepdown unit, but remained in the ER pending bed availability.  On 01/3 pts hypotension persisted requiring increased vasopressor requirement and O2 requirements increased to HFNC along with NRB, therefore PCCM team consulted to assist with management.    Past Medical History:  COPD Current Everyday Smoker Chronic Home O2 _1   Atrial fibrillation with RVR  (03/23/2020 EF >55%) HFpEF (Echo 03/23/2020 EF >55%) Iron Deficiency Anemia  Cancer   Significant Hospital Events:  01/2: Pt admitted to the progressive care cardiac unit with acute on chronic hypoxic respiratory failure secondary to CAP remained in the ER pending bed availability  01/3: Pt developed atrial fibrillation with rvr and hypotension requiring amiodarone and neo-synephrine gtts pt transferred to the stepdown unit but remained in the ER pending bed availability  01/3: Pt developed worsening acute on chronic hypoxic respiratory failure and hypotension with increased vasopressor requirement.  PCCM consulted and pt changed to ICU status   Consults:  Intensivist   Procedures:  None   Significant Diagnostic Tests:  CXR 01/2>>Bilateral pneumonia with worsening opacity at the bases CT Abd Pelvis 01/2>>Small bilateral pleural effusions with airspace infiltration in the lung bases, likely pneumonia. This may represent community-acquired pneumonia. COVID pneumonia does not typically involve pleural effusions. Mild diffuse fatty infiltration of the liver. Multiple nonobstructing stones in both kidneys. No hydronephrosis or hydroureter. Bladder wall thickening is new since the prior study, likely indicating cystitis versus outlet obstruction. Diffuse bone demineralization with multiple lumbar vertebral compression deformities. Old right pelvic fractures. Changes likely to represent osteoporosis. Aortic atherosclerosis. CXR 01/3>>Worsening bilateral lung consolidation, consistent with progressive airspace disease and atelectasis. Findings could reflect worsening pneumonia or edema  Micro Data:  Blood 01/2>>staph species 1/4 bottles  COVID-19/Influenza PCR 01/2>>negative  Respiratory 01/2>> Urine 01/2>> C-diff 01/3>>negative  GI panel by PCR 01/3>>negative   Antimicrobials:  Azithromycin 01/2 x1 dose Ceftriaxone 01/3>> Doxycycline 01/3>>  Interim History / Subjective:  Pt resting in be  with HFNC _2  and NRB in place states he is slightly short  of breath.  According to RN it has been difficult to pick up an accurate O2 sat on pt due to cold fingertips but most recent O2 sats has been 91% to 93%. Pt remains on neo-synephrine gtt due to hypotension.  Objective   Blood pressure (!) 119/105, pulse (!) 50, temperature 98.8 F (37.1 C), temperature source Oral, resp. rate (!) 21, SpO2 95 %.        Intake/Output Summary (Last 24 hours) at 07/21/2020 2022 Last data filed at 07/21/2020 1157 Gross per 24 hour  Intake 400 ml  Output --  Net 400 ml   There were no vitals filed for this visit.  Examination: General: acutely ill frail male, resting in bed with mild respiratory distress  HENT: supple, mild JVD present  Lungs: diffuse crackles and rhonchi throughout, mildly tachypneic Cardiovascular: irregular irregular, no R/G, 2+ radial pulses, 1+ distal pulses, trace bilateral lower extremity edema  Abdomen: +BS x4, soft, tender, non distended  Extremities: normal bulk and tone Neuro: alert and oriented, follows commands, PERRL  GU: foley in place poor UOP  Assessment & Plan:   Acute on chronic hypoxic respiratory failure secondary to CAP and pulmonary edema  Hx: COPD and current everyday smoker Supplemental O2 for dyspnea and/or hypoxia  Will start nebulized steroids and start stress dose steroids  Scheduled and prn nebulized therapy  Will stop NS due to worsening respiratory failure in the setting of pulmonary edema  Smoking cessation counseling provided   Hypotension secondary to sepsis and hypovolemia in the setting of n/v/diarrhea  Atrial fibrillation with rvr  HFpEF (Echo 03/23/2020 EF >55%) Continuous telemetry monitoring Neo-synephrine and vasopressin gtts to maintain map >65 Continue amiodarone gtt for rate control Stress dose steroids initiated (cortisol level 21.1) BNP and Echo pending  Continue aspirin   Hyponatremia due to volume depletion in the setting  of n/v/diarrhea  Hypokalemia  Trend BMP Replace electrolytes as indicated  Monitor UOP Will stop continuous iv fluids due to pulmonary edema   Sepsis secondary to CAP Trend WBC and monitor fever curve  Trend PCT  Follow cultures  Continue antimicrobial therapy as outlined above   N/V/diarrhea-improving  Prn zofran for nausea   GERD Continue pepcid   Best practice (evaluated daily)  Diet: Heart Healthy  Pain/Anxiety/Delirium protocol (if indicated): prn percocet for pain management  VAP protocol (if indicated): not indicated  DVT prophylaxis: subq lovenox  GI prophylaxis: pepcid  Glucose control: not indicated  Mobility: bedrest  Disposition: ICU   Goals of Care:  Last date of multidisciplinary goals of care discussion: N/A Family and staff present:N/A Summary of discussion: N/A Follow up goals of care discussion due: 07/22/20 Code Status: full code   Labs   CBC: Recent Labs  Lab 08/14/2020 1419 07/21/20 0530  WBC 22.4* 14.2*  NEUTROABS 19.8*  --   HGB 13.8 12.7*  HCT 39.3 37.3*  MCV 94.0 98.2  PLT 124* 102*    Basic Metabolic Panel: Recent Labs  Lab 08/08/2020 1419 08/14/2020 1950 07/21/20 0530  NA 117* 120* 124*  K 3.4* 3.0* 3.3*  CL 76* 83* 87*  CO2 _0 GLUCOSE 124* 97 96  BUN _1 CREATININE 0.52* 0.60* 0.69  CALCIUM 8.8* 7.8* 7.7*  MG 1.6*  --  2.2   GFR: CrCl cannot be calculated (Unknown ideal weight.). Recent Labs  Lab 07/27/2020 1419 07/27/2020 1442 07/31/2020 1647 07/27/2020 2152 07/21/20 0530 07/21/20 0937  PROCALCITON 2.58  --   --   --   --   --  WBC 22.4*  --   --   --  14.2*  --   LATICACIDVEN  --    < > 3.6* 2.9* 2.6* 0.8   < > = values in this interval not displayed.    Liver Function Tests: Recent Labs  Lab 08/13/2020 1419 07/21/20 0530  AST 48* 43*  ALT 19 15  ALKPHOS 87 55  BILITOT 2.5* 1.7*  PROT 6.8 4.9*  ALBUMIN 3.4* 2.5*   No results for input(s): LIPASE, AMYLASE in the last 168 hours. No results for  input(s): AMMONIA in the last 168 hours.  ABG    Component Value Date/Time   HCO3 30.5 (H) 08/11/2020 1623   O2SAT 33.3 07/27/2020 1623     Coagulation Profile: Recent Labs  Lab 07/27/2020 1647  INR 1.2    Cardiac Enzymes: No results for input(s): CKTOTAL, CKMB, CKMBINDEX, TROPONINI in the last 168 hours.  HbA1C: No results found for: HGBA1C  CBG: No results for input(s): GLUCAP in the last 168 hours.  Review of Systems: Positives in BOLD   Gen: Denies fever, chills, weight change, fatigue, night sweats HEENT: Denies blurred vision, double vision, hearing loss, tinnitus, sinus congestion, rhinorrhea, sore throat, neck stiffness, dysphagia PULM: shortness of breath, cough, sputum production, hemoptysis, wheezing CV: Denies chest pain, edema, orthopnea, paroxysmal nocturnal dyspnea, palpitations GI: abdominal pain, nausea, vomiting, diarrhea, hematochezia, melena, constipation, change in bowel habits GU: Denies dysuria, hematuria, polyuria, oliguria, urethral discharge Endocrine: Denies hot or cold intolerance, polyuria, polyphagia or appetite change Derm: Denies rash, dry skin, scaling or peeling skin change Heme: Denies easy bruising, bleeding, bleeding gums Neuro: Denies headache, numbness, weakness, slurred speech, loss of memory or consciousness   Past Medical History:  He,  has a past medical history of Cancer ([place]) and COPD (chronic obstructive pulmonary disease) ([place]).   Surgical History:   Past Surgical History:  Procedure Laterality Date  . COLON SURGERY    . INTRAMEDULLARY (IM) NAIL INTERTROCHANTERIC Right 01/03/2019   Procedure: INTRAMEDULLARY (IM) NAIL INTERTROCHANTRIC;  Surgeon: Corky Mull, MD;  Location: [facility] ORS;  Service: Orthopedics;  Laterality: Right;     Social History:   reports that he has been smoking cigarettes. He has been smoking about 0.50 packs per day. He has never used smokeless tobacco. He reports current alcohol use. He reports that he  does not use drugs.   Family History:  His family history is not on file.   Allergies Allergies  Allergen Reactions  . Sulfa Antibiotics Rash     Home Medications  Prior to Admission medications   Medication Sig Start Date End Date Taking? Authorizing Provider  aspirin EC 81 MG tablet Take 81 mg by mouth daily.   Yes [provider]  famotidine (PEPCID) 40 MG tablet Take 40 mg by mouth daily. 05/11/20  Yes [provider]  Fluticasone-Umeclidin-Vilant 100-62.5-25 MCG/INH AEPB Inhale 1 puff into the lungs daily. 10/19/18  Yes [provider]  oxyCODONE-acetaminophen (PERCOCET) 5-325 MG tablet Take 1 tablet by mouth every 4 (four) hours as needed for severe pain. 05/21/20 05/21/21 Yes Blake Divine, MD  tiZANidine (ZANAFLEX) 4 MG tablet Take 4 mg by mouth every 6 (six) hours as needed for muscle spasms. 12/29/18  Yes [provider]  acetaminophen (TYLENOL) 500 MG tablet Take 500 mg by mouth every 6 (six) hours as needed for pain.    [provider]  albuterol (PROVENTIL) (2.5 MG/3ML) 0.083% nebulizer solution Take 2.5 mg by nebulization every 6 (six) hours  as needed for wheezing or shortness of breath. 03/09/20   [provider]  albuterol (VENTOLIN HFA) 108 (90 Base) MCG/ACT inhaler Inhale 2 puffs into the lungs every 6 (six) hours as needed for wheezing. 01/08/15   [provider]  atorvastatin (LIPITOR) 40 MG tablet Take 40 mg by mouth daily. Patient not taking: Reported on 07/24/2020 08/11/18   [provider]  docusate sodium (COLACE) 100 MG capsule Take 1 capsule (100 mg total) by mouth 2 (two) times daily. 05/21/20 05/21/21  Blake Divine, MD  hydrocortisone (ANUSOL-HC) 25 MG suppository Place 1 suppository (25 mg total) rectally every 12 (twelve) hours. 05/21/20 05/21/21  Blake Divine, MD  ibandronate (BONIVA) 150 MG tablet Take 150 mg by mouth every 30 (thirty) days. Patient not taking: Reported on 08/08/2020 02/01/20    [provider]  metoprolol succinate (TOPROL-XL) 25 MG 24 hr tablet Take 25 mg by mouth daily. Patient not taking: Reported on 08/14/2020 05/11/20   [provider]  naloxone [facility]) 4 MG/0.1ML LIQD nasal spray kit Place 4 mg into the nose once. 10/04/19   [provider]  predniSONE (DELTASONE) 10 MG tablet Take 10 mg by mouth daily. Patient not taking: Reported on 08/02/2020 11/23/18   [provider]     Critical care time: 1 hr      Marda Stalker, [place] Pager [phone] (please enter 7 digits) PCCM Consult Pager [phone] (please enter 7 digits)

## 2020-07-21 NOTE — ED Notes (Signed)
Pt reports improved breathing. Provided with cup of water per request

## 2020-07-21 NOTE — Hospital Course (Addendum)
Darren Coleman  is a 70 y.o. Caucasian male with a known history of COPD and chronic respiratory failure with ongoing tobacco abuse and CHF, who presented to the ED on 08/10/2020 with worsening dyspnea and associated productive cough, and wheezing for 4 days.  Also reported poor appetite, nausea vomiting, diarrhea and general malaise but no fevers or chills.  Evaluation in the ED, tachycardic with heart rate 133, tachypneic with respiratory rate in the 30s.  Chest x-ray showed left upper lobe airspace disease consistent with pneumonia.  CT of abdomen and pelvis showed likely bibasilar pneumonia but no acute abdominal findings.  First overnight of admission, patient developed A-fib with RVR and severe hypotension requiring vasopressor.  Started on amiodarone infusion.  Level of care upgraded to stepdown unit, still on hold for a bed in the ED.

## 2020-07-22 ENCOUNTER — Inpatient Hospital Stay: Payer: Medicare Other

## 2020-07-22 ENCOUNTER — Inpatient Hospital Stay
Admit: 2020-07-22 | Discharge: 2020-07-22 | Disposition: A | Discharge status: Home / Self Care | Payer: Medicare Other | Attending: Critical Care Medicine | Admitting: Critical Care Medicine

## 2020-07-22 ENCOUNTER — Other Ambulatory Visit: Payer: Self-pay

## 2020-07-22 DIAGNOSIS — J15212 Pneumonia due to Methicillin resistant Staphylococcus aureus: Secondary | ICD-10-CM

## 2020-07-22 DIAGNOSIS — J9621 Acute and chronic respiratory failure with hypoxia: Secondary | ICD-10-CM

## 2020-07-22 DIAGNOSIS — R6521 Severe sepsis with septic shock: Secondary | ICD-10-CM

## 2020-07-22 DIAGNOSIS — J449 Chronic obstructive pulmonary disease, unspecified: Secondary | ICD-10-CM

## 2020-07-22 DIAGNOSIS — A419 Sepsis, unspecified organism: Secondary | ICD-10-CM

## 2020-07-22 DIAGNOSIS — J189 Pneumonia, unspecified organism: Secondary | ICD-10-CM | POA: Diagnosis not present

## 2020-07-22 DIAGNOSIS — I469 Cardiac arrest, cause unspecified: Secondary | ICD-10-CM

## 2020-07-22 DIAGNOSIS — E43 Unspecified severe protein-calorie malnutrition: Secondary | ICD-10-CM | POA: Insufficient documentation

## 2020-07-22 LAB — BLOOD GAS, ARTERIAL
Acid-base deficit: 5.9 mmol/L — ABNORMAL HIGH (ref 0.0–2.0)
Acid-base deficit: 10.4 mmol/L — ABNORMAL HIGH (ref 0.0–2.0)
Bicarbonate: 20.7 mmol/L (ref 20.0–28.0)
Bicarbonate: 18.9 mmol/L — ABNORMAL LOW (ref 20.0–28.0)
Delivery systems: POSITIVE
Expiratory PAP: 5
FIO2: 1
FIO2: 100
Inspiratory PAP: 14
MECHVT: 450 mL
O2 Saturation: 94.6 %
O2 Saturation: 83.8 %
PEEP: 5 cmH2O
Patient temperature: 37
Patient temperature: 37
RATE: 16 resp/min
pCO2 arterial: 44 mmHg (ref 32.0–48.0)
pCO2 arterial: 58 mmHg — ABNORMAL HIGH (ref 32.0–48.0)
pH, Arterial: 7.28 — ABNORMAL LOW (ref 7.350–7.450)
pH, Arterial: 7.12 — CL (ref 7.350–7.450)
pO2, Arterial: 83 mmHg (ref 83.0–108.0)
pO2, Arterial: 65 mmHg — ABNORMAL LOW (ref 83.0–108.0)

## 2020-07-22 LAB — CBC WITH DIFFERENTIAL/PLATELET
Abs Immature Granulocytes: 0.48 10*3/uL — ABNORMAL HIGH (ref 0.00–0.07)
Basophils Absolute: 0 10*3/uL (ref 0.0–0.1)
Basophils Relative: 0 %
Eosinophils Absolute: 0.1 10*3/uL (ref 0.0–0.5)
Eosinophils Relative: 1 %
HCT: 33.6 % — ABNORMAL LOW (ref 39.0–52.0)
Hemoglobin: 11.1 g/dL — ABNORMAL LOW (ref 13.0–17.0)
Immature Granulocytes: 3 %
Lymphocytes Relative: 2 %
Lymphs Abs: 0.3 10*3/uL — ABNORMAL LOW (ref 0.7–4.0)
MCH: 33.1 pg (ref 26.0–34.0)
MCHC: 33 g/dL (ref 30.0–36.0)
MCV: 100.3 fL — ABNORMAL HIGH (ref 80.0–100.0)
Monocytes Absolute: 1.6 10*3/uL — ABNORMAL HIGH (ref 0.1–1.0)
Monocytes Relative: 10 %
Neutro Abs: 12.9 10*3/uL — ABNORMAL HIGH (ref 1.7–7.7)
Neutrophils Relative %: 84 %
Platelets: 113 10*3/uL — ABNORMAL LOW (ref 150–400)
RBC: 3.35 MIL/uL — ABNORMAL LOW (ref 4.22–5.81)
RDW: 20.3 % — ABNORMAL HIGH (ref 11.5–15.5)
Smear Review: NORMAL
WBC: 15.3 10*3/uL — ABNORMAL HIGH (ref 4.0–10.5)
nRBC: 0.3 % — ABNORMAL HIGH (ref 0.0–0.2)

## 2020-07-22 LAB — GLUCOSE, RANDOM: Glucose, Bld: 53 mg/dL — ABNORMAL LOW (ref 70–99)

## 2020-07-22 LAB — URINE CULTURE: Culture: <10000 — AB

## 2020-07-22 LAB — STREP PNEUMONIAE URINARY ANTIGEN: Strep Pneumo Urinary Antigen: NEGATIVE

## 2020-07-22 LAB — ECHOCARDIOGRAM COMPLETE
AR max vel: 2.5 cm2
AV Area VTI: 2.8 cm2
AV Area mean vel: 2.58 cm2
AV Mean grad: 2 mmHg
AV Peak grad: 5.1 mmHg
Ao pk vel: 1.13 m/s
Area-P 1/2: 4.33 cm2
Height: 66 in
S' Lateral: 2.3 cm
Weight: 2081.14 oz

## 2020-07-22 LAB — GLUCOSE, CAPILLARY
Glucose-Capillary: 70 mg/dL (ref 70–99)
Glucose-Capillary: 69 mg/dL — ABNORMAL LOW (ref 70–99)
Glucose-Capillary: 124 mg/dL — ABNORMAL HIGH (ref 70–99)
Glucose-Capillary: 88 mg/dL (ref 70–99)
Glucose-Capillary: <10 mg/dL — CL (ref 70–99)

## 2020-07-22 LAB — LACTIC ACID, PLASMA
Lactic Acid, Venous: 3.4 mmol/L (ref 0.5–1.9)
Lactic Acid, Venous: 3.9 mmol/L (ref 0.5–1.9)

## 2020-07-22 LAB — LACTATE DEHYDROGENASE: LDH: 185 U/L (ref 98–192)

## 2020-07-22 LAB — LEGIONELLA PNEUMOPHILA SEROGP 1 UR AG: L. pneumophila Serogp 1 Ur Ag: NEGATIVE

## 2020-07-22 LAB — MRSA PCR SCREENING: MRSA by PCR: POSITIVE — AB

## 2020-07-22 LAB — MAGNESIUM: Magnesium: 2.6 mg/dL — ABNORMAL HIGH (ref 1.7–2.4)

## 2020-07-22 LAB — OSMOLALITY, URINE: Osmolality, Ur: 291 mOsm/kg — ABNORMAL LOW (ref 300–900)

## 2020-07-22 LAB — HEPARIN LEVEL (UNFRACTIONATED): Heparin Unfractionated: 0.26 IU/mL — ABNORMAL LOW (ref 0.30–0.70)

## 2020-07-22 LAB — BRAIN NATRIURETIC PEPTIDE: B Natriuretic Peptide: 720.2 pg/mL — ABNORMAL HIGH (ref 0.0–100.0)

## 2020-07-22 LAB — NA AND K (SODIUM & POTASSIUM), RAND UR
Potassium Urine: 55 mmol/L
Sodium, Ur: 21 mmol/L

## 2020-07-22 LAB — PROCALCITONIN: Procalcitonin: 44.03 ng/mL

## 2020-07-22 MED ORDER — DEXTROSE 50 % IV SOLN
12.5000 g | INTRAVENOUS | Status: AC
  Administered 2020-07-22: 12.5 g via INTRAVENOUS
  Filled 2020-07-22: qty 50

## 2020-07-22 MED ORDER — DEXTROSE 10 % IV SOLN: INTRAVENOUS | Status: DC

## 2020-07-22 MED ORDER — DEXTROSE 50 % IV SOLN
INTRAVENOUS | Status: AC
  Administered 2020-07-22: 50 mL via INTRAVENOUS
  Filled 2020-07-22: qty 50

## 2020-07-22 MED ORDER — FENTANYL 2500MCG IN NS 250ML (10MCG/ML) PREMIX INFUSION: 0.0000 ug/h | INTRAVENOUS | Status: DC

## 2020-07-22 MED ORDER — FENTANYL CITRATE (PF) 100 MCG/2ML IJ SOLN: 150.0000 ug | Freq: Once | INTRAMUSCULAR | Status: AC

## 2020-07-22 MED ORDER — FENTANYL CITRATE (PF) 100 MCG/2ML IJ SOLN
INTRAMUSCULAR | Status: AC
  Administered 2020-07-22: 150 ug via INTRAVENOUS
  Filled 2020-07-22: qty 4

## 2020-07-22 MED ORDER — ENSURE ENLIVE PO LIQD
237.0000 mL | Freq: Three times a day (TID) | ORAL | Status: DC
  Administered 2020-07-22: 237 mL via ORAL

## 2020-07-22 MED ORDER — VANCOMYCIN HCL 750 MG/150ML IV SOLN
750.0000 mg | Freq: Two times a day (BID) | INTRAVENOUS | Status: DC
  Filled 2020-07-22: qty 150

## 2020-07-22 MED ORDER — ROCURONIUM BROMIDE 50 MG/5ML IV SOLN
INTRAVENOUS | Status: AC
  Administered 2020-07-22: 40 mg via INTRAVENOUS
  Filled 2020-07-22: qty 1

## 2020-07-22 MED ORDER — FUROSEMIDE 10 MG/ML IJ SOLN
40.0000 mg | Freq: Every day | INTRAMUSCULAR | Status: DC
  Administered 2020-07-22: 40 mg via INTRAVENOUS
  Filled 2020-07-22: qty 4

## 2020-07-22 MED ORDER — NOREPINEPHRINE 16 MG/250ML-% IV SOLN
0.0000 ug/min | INTRAVENOUS | Status: DC
  Administered 2020-07-23: 10 ug/min via INTRAVENOUS
  Filled 2020-07-22: qty 250

## 2020-07-22 MED ORDER — VANCOMYCIN HCL 1250 MG/250ML IV SOLN
1250.0000 mg | Freq: Once | INTRAVENOUS | Status: AC
  Administered 2020-07-22: 1250 mg via INTRAVENOUS
  Filled 2020-07-22: qty 250

## 2020-07-22 MED ORDER — HEPARIN (PORCINE) 25000 UT/250ML-% IV SOLN
1000.0000 [IU]/h | INTRAVENOUS | Status: DC
  Administered 2020-07-22: 17:00:00 850 [IU]/h via INTRAVENOUS
  Filled 2020-07-22: qty 250

## 2020-07-22 MED ORDER — MUPIROCIN 2 % EX OINT
1.0000 "application " | TOPICAL_OINTMENT | Freq: Two times a day (BID) | CUTANEOUS | Status: DC
  Administered 2020-07-22 (×2): 1 via NASAL
  Filled 2020-07-22: qty 22

## 2020-07-22 MED ORDER — SODIUM BICARBONATE 8.4 % IV SOLN
50.0000 meq | Freq: Once | INTRAVENOUS | Status: AC
  Administered 2020-07-22: 50 meq via INTRAVENOUS
  Filled 2020-07-22: qty 50

## 2020-07-22 MED ORDER — ADULT MULTIVITAMIN W/MINERALS CH: 1.0000 | ORAL_TABLET | Freq: Every day | ORAL | Status: DC

## 2020-07-22 MED ORDER — ROCURONIUM BROMIDE 50 MG/5ML IV SOLN: 40.0000 mg | Freq: Once | INTRAVENOUS | Status: AC

## 2020-07-22 MED ORDER — HEPARIN BOLUS VIA INFUSION
1800.0000 [IU] | Freq: Once | INTRAVENOUS | Status: AC
  Administered 2020-07-22: 1800 [IU] via INTRAVENOUS
  Filled 2020-07-22: qty 1800

## 2020-07-22 MED ORDER — INSULIN ASPART 100 UNIT/ML ~~LOC~~ SOLN: 0.0000 [IU] | SUBCUTANEOUS | Status: DC

## 2020-07-22 MED ORDER — FENTANYL 2500MCG IN NS 250ML (10MCG/ML) PREMIX INFUSION
INTRAVENOUS | Status: AC
  Administered 2020-07-22: 25 ug/h via INTRAVENOUS
  Filled 2020-07-22: qty 250

## 2020-07-22 MED ORDER — DEXTROSE 50 % IV SOLN
25.0000 g | INTRAVENOUS | Status: AC
  Administered 2020-07-22: 25 g via INTRAVENOUS
  Filled 2020-07-22: qty 50

## 2020-07-22 MED ORDER — DEXTROSE 50 % IV SOLN: 1.0000 | Freq: Once | INTRAVENOUS | Status: AC

## 2020-07-22 NOTE — Progress Notes (Signed)
Pharmacy Antibiotic Note  Darren Coleman is a 70 y.o. male admitted on 07/19/2020 with pneumonia.  Pharmacy has been consulted for Vancomycin dosing.  Plan: Vancomycin 1250mg  x 1 dose then 750mg  IV q12hrs (per nomogram)  Height: 5\' 6"  (167.6 cm) Weight: 59 kg (130 lb 1.1 oz) IBW/kg (Calculated) : 63.8  Temp (24hrs), Avg:100.1 F (37.8 C), Min:98.8 F (37.1 C), Max:100.94 F (38.3 C)  Recent Labs  Lab 07/26/2020 1419 08/15/2020 1442 08/17/2020 1647 08/15/2020 1950 08/04/2020 2152 07/21/20 0530 07/21/20 0937 07/21/20 2336  WBC 22.4*  --   --   --   --  14.2*  --   --   CREATININE 0.52*  --   --  0.60*  --  0.69  --  0.84  LATICACIDVEN  --  2.8* 3.6*  --  2.9* 2.6* 0.8  --     Estimated Creatinine Clearance: 71.2 mL/min (by C-G formula based on SCr of 0.84 mg/dL).    Allergies  Allergen Reactions  . Sulfa Antibiotics Rash    Antimicrobials this admission:   >>    >>   Dose adjustments this admission:   Microbiology results:  BCx:   UCx:    Sputum:    MRSA PCR:   Thank you for allowing pharmacy to be a part of this patient's care.  09/18/20 A 07/22/2020 6:12 AM

## 2020-07-22 NOTE — Consult Note (Signed)
NAME:  Darren Coleman, MRN:  [id number], DOB:  02/07/1951, LOS: 2 ADMISSION DATE:  08/10/2020, CONSULTATION DATE: 07/21/2020 REFERRING MD: Dr. Sidney Ace, CHIEF COMPLAINT: Hypotension  Brief History:  70 yo male admitted with nausea/vomiting/diarrhea and acute on chronic hypoxic respiratory failure secondary to CAP developed atrial fibrillation with rvr along with hypotension requiring amiodarone gtt and vasopressors  History of Present Illness:  This is a 70 yo male who presented to [facility] ER on 01/2 from home via EMS with c/o worsening cough, shortness of breath, abdominal pain, n/v, and diarrhea.  Upon arrival to the ER pts O2 sats were 99% on his chronic home O2 _0  via nasal canula, however he was noted to be tachycardic with hr 133.  Lab results revealed Na+ 117, K+ 3.4, chloride 76, calcium 7.7, AST 48, lactic acid 2.8, pct 2.58, wbc 22.4, platelets 124, UA negative for UTI, and vbg pH 7.42/pCO2 46/bicarb 30.5.  EKG revealed sinus tachycardia with a right BBB. COVID-19/Influenza PCR negative, however CXR concerning for pneumonia.  Sepsis protocol initiated pt received 1.5L LR bolus, azithromycin, and ceftriaxone.  He was subsequently admitted to the progressive care unit per hospitalist team for additional workup and treatment, but remained in the ER pending bed availability. On 01/3 the pt developed atrial fibrillation with rvr along with hypotension.  He received 500 ml bolus and amiodarone bolus followed by continuous infusion.  Following interventions pts hr improved, however he remained hypotensive requiring neo-synephrine gtt and transfer to the stepdown unit, but remained in the ER pending bed availability.  On 01/3 pts hypotension persisted requiring increased vasopressor requirement and O2 requirements increased to HFNC along with NRB, therefore PCCM team consulted to assist with management.     07/23/19- I spoke with daughter Darren Coleman today - Darren Coleman reviewed >>Full code.  Patient progressively declining  with SpO2 <80% on BIPAP and on 2 vasopressors in septic shock.  Daughter wishes for fully aggressive measures.   Past Medical History:  COPD Current Everyday Smoker Chronic Home O2 _1   Atrial fibrillation with RVR (03/23/2020 EF >55%) HFpEF (Echo 03/23/2020 EF >55%) Iron Deficiency Anemia  Cancer   Significant Hospital Events:  01/2: Pt admitted to the progressive care cardiac unit with acute on chronic hypoxic respiratory failure secondary to CAP remained in the ER pending bed availability  01/3: Pt developed atrial fibrillation with rvr and hypotension requiring amiodarone and neo-synephrine gtts pt transferred to the stepdown unit but remained in the ER pending bed availability  01/3: Pt developed worsening acute on chronic hypoxic respiratory failure and hypotension with increased vasopressor requirement.  PCCM consulted and pt changed to ICU status   Consults:  Intensivist   Procedures:  None   Significant Diagnostic Tests:  CXR 01/2>>Bilateral pneumonia with worsening opacity at the bases CT Abd Pelvis 01/2>>Small bilateral pleural effusions with airspace infiltration in the lung bases, likely pneumonia. This may represent community-acquired pneumonia. COVID pneumonia does not typically involve pleural effusions. Mild diffuse fatty infiltration of the liver. Multiple nonobstructing stones in both kidneys. No hydronephrosis or hydroureter. Bladder wall thickening is new since the prior study, likely indicating cystitis versus outlet obstruction. Diffuse bone demineralization with multiple lumbar vertebral compression deformities. Old right pelvic fractures. Changes likely to represent osteoporosis. Aortic atherosclerosis. CXR 01/3>>Worsening bilateral lung consolidation, consistent with progressive airspace disease and atelectasis. Findings could reflect worsening pneumonia or edema  Micro Data:  Blood 01/2>>staph species 1/4 bottles  COVID-19/Influenza PCR 01/2>>negative   Respiratory 01/2>> Urine 01/2>> C-diff 01/3>>negative  GI  panel by PCR 01/3>>negative   Antimicrobials:  Azithromycin 01/2 x1 dose Ceftriaxone 01/3>> Doxycycline 01/3>>  Interim History / Subjective:  Pt resting in be with HFNC _0  and NRB in place states he is slightly short of breath.  According to RN it has been difficult to pick up an accurate O2 sat on pt due to cold fingertips but most recent O2 sats has been 91% to 93%. Pt remains on neo-synephrine gtt due to hypotension.  Objective   Blood pressure 94/76, pulse (!) 107, temperature 99.32 F (37.4 C), resp. rate (!) 23, height _1  (1.676 m), weight 59 kg, SpO2 96 %.    Vent Mode: PRVC FiO2 (%):  [100 %] 100 % Set Rate:  [16 bmp] 16 bmp Vt Set:  [450 mL] 450 mL PEEP:  [5 cmH20] 5 cmH20   Intake/Output Summary (Last 24 hours) at 07/22/2020 1555 Last data filed at 07/22/2020 0600 Gross per 24 hour  Intake 2490.47 ml  Output 375 ml  Net 2115.47 ml   Filed Weights   07/22/20 0101  Weight: 59 kg    Examination: General: acutely ill frail male, resting in bed with mild respiratory distress  HENT: supple, mild JVD present  Lungs: diffuse crackles and rhonchi throughout, mildly tachypneic Cardiovascular: irregular irregular, no R/G, 2+ radial pulses, 1+ distal pulses, trace bilateral lower extremity edema  Abdomen: +BS x4, soft, tender, non distended  Extremities: normal bulk and tone Neuro: alert and oriented, follows commands, PERRL  GU: foley in place poor UOP  Assessment & Plan:   Acute on chronic hypoxic respiratory failure   secondary to CAP and pulmonary edema  Hx: COPD and current everyday smoker Supplemental O2 for dyspnea and/or hypoxia  Will start nebulized steroids and start stress dose steroids  Scheduled and prn nebulized therapy  Will stop NS due to worsening respiratory failure in the setting of pulmonary edema  Smoking cessation counseling provided    Septic shock  -present on admission - due  to Left lowe lobe pneumonia - probable stre pneumoniae -s/p Vanco x1 , doxy IV and Rocephin IV -lactate trend -IVF -Vasopressor - Neosynephrine and vasopressin -solucortef 50 q6h HFpEF (Echo 03/23/2020 EF >55%) Continuous telemetry monitoring BNP and Echo pending  Continue aspirin   Severe protein calorie malnutrition -bitermporal and peripheral muscle wasting   Hyponatremia due to volume depletion in the setting of n/v/diarrhea  Hypokalemia  Trend BMP Replace electrolytes as indicated  Monitor UOP Will stop continuous iv fluids due to pulmonary edema   Atrial fibrillation with rapid ventricular response   - decrease Neo , add vaso   - digoxin   - amiodaroe gtt   N/V/diarrhea-improving  Prn zofran for nausea   GERD Continue pepcid   Best practice (evaluated daily)  Diet: Heart Healthy  Pain/Anxiety/Delirium protocol (if indicated): prn percocet for pain management  VAP protocol (if indicated): not indicated  DVT prophylaxis: subq lovenox  GI prophylaxis: pepcid  Glucose control: not indicated  Mobility: bedrest  Disposition: ICU   Goals of Care:  Last date of multidisciplinary goals of care discussion: N/A Family and staff present:N/A Summary of discussion: N/A Follow up goals of care discussion due: 07/22/20 Code Status: full code   Labs   CBC: Recent Labs  Lab 07/27/2020 1419 07/21/20 0530 07/22/20 0553  WBC 22.4* 14.2* 15.3*  NEUTROABS 19.8*  --  12.9*  HGB 13.8 12.7* 11.1*  HCT 39.3 37.3* 33.6*  MCV 94.0 98.2 100.3*  PLT 124* 102* 113*  Basic Metabolic Panel: Recent Labs  Lab 07/31/2020 1419 08/06/2020 1950 07/21/20 0530 07/21/20 2336 07/22/20 0553  NA 117* 120* 124* 125*  --   K 3.4* 3.0* 3.3* 4.2  --   CL 76* 83* 87* 94*  --   CO2 _0 20*  --   GLUCOSE 124* 97 96 88  --   BUN _1 --   CREATININE 0.52* 0.60* 0.69 0.84  --   CALCIUM 8.8* 7.8* 7.7* 7.3*  --   MG 1.6*  --  2.2  --  2.6*   GFR: Estimated Creatinine  Clearance: 71.2 mL/min (by C-G formula based on SCr of 0.84 mg/dL). Recent Labs  Lab 07/28/2020 1419 08/06/2020 1442 07/21/20 0530 07/21/20 0937 07/22/20 0553 07/22/20 0556 07/22/20 1053  PROCALCITON 2.58  --   --   --   --   --   --   WBC 22.4*  --  14.2*  --  15.3*  --   --   LATICACIDVEN  --    < > 2.6* 0.8  --  3.4* 3.9*   < > = values in this interval not displayed.    Liver Function Tests: Recent Labs  Lab 08/02/2020 1419 07/21/20 0530 07/21/20 2336  AST 48* 43* 44*  ALT _2 ALKPHOS 87 55 66  BILITOT 2.5* 1.7* 1.1  PROT 6.8 4.9* 4.7*  ALBUMIN 3.4* 2.5* 2.0*   No results for input(s): LIPASE, AMYLASE in the last 168 hours. No results for input(s): AMMONIA in the last 168 hours.  ABG    Component Value Date/Time   PHART 7.12 (LL) 07/22/2020 1532   PCO2ART 58 (H) 07/22/2020 1532   PO2ART 65 (L) 07/22/2020 1532   HCO3 18.9 (L) 07/22/2020 1532   ACIDBASEDEF 10.4 (H) 07/22/2020 1532   O2SAT 83.8 07/22/2020 1532     Coagulation Profile: Recent Labs  Lab 07/29/2020 1647  INR 1.2    Cardiac Enzymes: No results for input(s): CKTOTAL, CKMB, CKMBINDEX, TROPONINI in the last 168 hours.  HbA1C: No results found for: HGBA1C  CBG: Recent Labs  Lab 07/21/20 2303 07/22/20 0318 07/22/20 0435 07/22/20 0724  GLUCAP 70 69* 124* 88    Review of Systems: Positives in BOLD   Gen: Denies fever, chills, weight change, fatigue, night sweats HEENT: Denies blurred vision, double vision, hearing loss, tinnitus, sinus congestion, rhinorrhea, sore throat, neck stiffness, dysphagia PULM: shortness of breath, cough, sputum production, hemoptysis, wheezing CV: Denies chest pain, edema, orthopnea, paroxysmal nocturnal dyspnea, palpitations GI: abdominal pain, nausea, vomiting, diarrhea, hematochezia, melena, constipation, change in bowel habits GU: Denies dysuria, hematuria, polyuria, oliguria, urethral discharge Endocrine: Denies hot or cold intolerance, polyuria, polyphagia  or appetite change Derm: Denies rash, dry skin, scaling or peeling skin change Heme: Denies easy bruising, bleeding, bleeding gums Neuro: Denies headache, numbness, weakness, slurred speech, loss of memory or consciousness   Past Medical History:  He,  has a past medical history of Atrial fibrillation with RVR ([place]), Cancer ([place]), COPD (chronic obstructive pulmonary disease) ([place]), and Iron deficiency anemia.   Surgical History:   Past Surgical History:  Procedure Laterality Date  . COLON SURGERY    . INTRAMEDULLARY (IM) NAIL INTERTROCHANTERIC Right 01/03/2019   Procedure: INTRAMEDULLARY (IM) NAIL INTERTROCHANTRIC;  Surgeon: Corky Mull, MD;  Location: [facility] ORS;  Service: Orthopedics;  Laterality: Right;     Social History:   reports that he has been smoking cigarettes. He has been smoking about 0.50 packs  per day. He has never used smokeless tobacco. He reports current alcohol use. He reports that he does not use drugs.   Family History:  His family history is not on file.   Allergies Allergies  Allergen Reactions  . Sulfa Antibiotics Rash     Home Medications  Prior to Admission medications   Medication Sig Start Date End Date Taking? Authorizing Provider  aspirin EC 81 MG tablet Take 81 mg by mouth daily.   Yes [provider]  famotidine (PEPCID) 40 MG tablet Take 40 mg by mouth daily. 05/11/20  Yes [provider]  Fluticasone-Umeclidin-Vilant 100-62.5-25 MCG/INH AEPB Inhale 1 puff into the lungs daily. 10/19/18  Yes [provider]  oxyCODONE-acetaminophen (PERCOCET) 5-325 MG tablet Take 1 tablet by mouth every 4 (four) hours as needed for severe pain. 05/21/20 05/21/21 Yes Blake Divine, MD  tiZANidine (ZANAFLEX) 4 MG tablet Take 4 mg by mouth every 6 (six) hours as needed for muscle spasms. 12/29/18  Yes [provider]  acetaminophen (TYLENOL) 500 MG tablet Take 500 mg by mouth every 6 (six) hours as needed for pain.    [provider]  albuterol (PROVENTIL) (2.5 MG/3ML) 0.083% nebulizer solution Take 2.5 mg by nebulization every 6 (six) hours as needed for wheezing or shortness of breath. 03/09/20   [provider]  albuterol (VENTOLIN HFA) 108 (90 Base) MCG/ACT inhaler Inhale 2 puffs into the lungs every 6 (six) hours as needed for wheezing. 01/08/15   [provider]  atorvastatin (LIPITOR) 40 MG tablet Take 40 mg by mouth daily. Patient not taking: Reported on 07/27/2020 08/11/18   [provider]  docusate sodium (COLACE) 100 MG capsule Take 1 capsule (100 mg total) by mouth 2 (two) times daily. 05/21/20 05/21/21  Blake Divine, MD  hydrocortisone (ANUSOL-HC) 25 MG suppository Place 1 suppository (25 mg total) rectally every 12 (twelve) hours. 05/21/20 05/21/21  Blake Divine, MD  ibandronate (BONIVA) 150 MG tablet Take 150 mg by mouth every 30 (thirty) days. Patient not taking: Reported on 08/16/2020 02/01/20   [provider]  metoprolol succinate (TOPROL-XL) 25 MG 24 hr tablet Take 25 mg by mouth daily. Patient not taking: Reported on 07/19/2020 05/11/20   [provider]  naloxone [facility]) 4 MG/0.1ML LIQD nasal spray kit Place 4 mg into the nose once. 10/04/19   [provider]  predniSONE (DELTASONE) 10 MG tablet Take 10 mg by mouth daily. Patient not taking: Reported on 08/10/2020 11/23/18   [provider]     Critical care time: 43    Critical care provider statement:    Critical care time (minutes):  33   Critical care time was exclusive of:  Separately billable procedures and  treating other patients   Critical care was necessary to treat or prevent imminent or  life-threatening deterioration of the following conditions:  septic shock, pneumonia, afrvr, multiple comorbid conditions   Critical care was time spent personally by me on the following  activities:  Development of treatment plan with patient or surrogate,  discussions with  consultants, evaluation of patient's response to  treatment, examination of patient, obtaining history from patient or  surrogate, ordering and performing treatments and interventions, ordering  and review of laboratory studies and re-evaluation of patient's condition   I assumed direction of critical care for this patient from another  provider in my specialty: no      Ottie Glazier, M.D.  [facility] & [place] -  [place]

## 2020-07-22 NOTE — Procedures (Signed)
Central Venous Catheter Insertion Procedure Note  Darren Coleman  [id number]  Nov 02, 1950  Date:07/22/20  Time:11:33 PM   Provider Performing:Darren Coleman D Darren Coleman   Procedure: Insertion of Non-tunneled Central Venous [phone]) with US guidance ([id number])   Indication(s) Medication administration and Difficult access  Consent Risks of the procedure as well as the alternatives and risks of each were explained to the patient and/or caregiver.  Consent for the procedure was obtained and is signed in the bedside chart  Anesthesia Topical only with 1% lidocaine   Timeout Verified patient identification, verified procedure, site/side was marked, verified correct patient position, special equipment/implants available, medications/allergies/relevant history reviewed, required imaging and test results available.  Sterile Technique Maximal sterile technique including full sterile barrier drape, hand hygiene, sterile gown, sterile gloves, mask, hair covering, sterile ultrasound probe cover (if used).  Procedure Description Area of catheter insertion was cleaned with chlorhexidine and draped in sterile fashion.  With real-time ultrasound guidance a central venous catheter was placed into the left internal jugular vein. Nonpulsatile blood flow and easy flushing noted in all ports.  The catheter was sutured in place and sterile dressing applied.  Complications/Tolerance None; patient tolerated the procedure well. Chest X-ray is ordered to verify placement for internal jugular or subclavian cannulation.   Chest x-ray is not ordered for femoral cannulation.  EBL Minimal  Specimen(s) None   Line secured at the 20 cm mark.  BIOPATCH applied to the insertion site.    Darren Coleman, AGACNP-BC [facility] Pager: [phone]

## 2020-07-22 NOTE — Consult Note (Signed)
ANTICOAGULATION CONSULT NOTE - Follow Up Consult  Pharmacy Consult for Heparin infusion Indication: atrial fibrillation  Allergies  Allergen Reactions  . Sulfa Antibiotics Rash    Patient Measurements: Height: 5\' 6"  (167.6 cm) Weight: 59 kg (130 lb 1.1 oz) IBW/kg (Calculated) : 63.8 Heparin Dosing Weight: 59 kg  Vital Signs: Temp: 99.32 F (37.4 C) (01/04 0645) BP: 94/76 (01/04 0630)  Labs: Recent Labs    08/06/2020 1419 08/12/2020 1647 07/19/2020 1815 07/28/2020 1950 07/21/20 0530 07/21/20 2336 07/22/20 0553  HGB 13.8  --   --   --  12.7*  --  11.1*  HCT 39.3  --   --   --  37.3*  --  33.6*  PLT 124*  --   --   --  102*  --  113*  APTT  --  37*  --   --   --   --   --   LABPROT  --  14.4  --   --   --   --   --   INR  --  1.2  --   --   --   --   --   CREATININE 0.52*  --   --  0.60* 0.69 0.84  --   TROPONINIHS 18*  --  30*  --   --   --   --     Estimated Creatinine Clearance: 71.2 mL/min (by C-G formula based on SCr of 0.84 mg/dL).   Medications:  Enoxaparin 40 mg SQ q24h  -- last dose 01/04 0030  Assessment: 70 yo male admitted with nausea/vomiting/diarrhea and acute on chronic hypoxic respiratory failure secondary to CAP developed atrial fibrillation with rvr along with hypotension requiring amiodarone gtt, vasopressors, and intubation. Received 2 doses of prophylactic enoxaparin since admission.    Goal of Therapy:  Heparin level 0.3-0.7 units/ml Monitor platelets by anticoagulation protocol: Yes   Plan:   Given prior prophylactic enoxaparin exposure, will give reduced bolus of 1800 units  x 1  Start heparin infusion at 850 units/hr  Check anti-Xa (Heparin) level in 6 hours and daily while on heparin   Continue to monitor H&H and platelets daily   79, PharmD, BCPS Clinical Pharmacist  07/22/2020,4:30 PM

## 2020-07-22 NOTE — Progress Notes (Signed)
Pt now able to say yes when asked if he can hear me and able to follow commands since placed on Bipap.  Will continue to monitor and assess pt.  Sonda Rumble, AGNP  Pulmonary/Critical Care Pager [phone] (please enter 7 digits) PCCM Consult Pager [phone] (please enter 7 digits)

## 2020-07-22 NOTE — Procedures (Signed)
Endotracheal Intubation: Patient required placement of an artificial airway secondary to Respiratory Failure  Consent: Daughter Darren Coleman gave consent witnessed by Darren Sills RN  Hand washing performed prior to starting the procedure.   Medications administered for sedation prior to procedure:  ,  Rocuronium 40 mg IV, Fentanyl 100 mcg IV.    A time out procedure was called and correct patient, name, & ID confirmed. Needed supplies and equipment were assembled and checked to include ETT, 10 ml syringe, Glidescope, Mac and Miller blades, suction, oxygen and bag mask valve, end tidal CO2 monitor.   Patient was positioned to align the mouth and pharynx to facilitate visualization of the glottis.   Heart rate, SpO2 and blood pressure was continuously monitored during the procedure. Pre-oxygenation was conducted prior to intubation and endotracheal tube was placed through the vocal cords into the trachea.     The artificial airway was placed under direct visualization via glidescope route using a 7.5 ETT on the first attempt.  ETT was secured at 25 cm mark.  Placement was confirmed by auscuitation of lungs with good breath sounds bilaterally and no stomach sounds.  Condensation was noted on endotracheal tube.   Pulse ox 98%.  CO2 detector in place with appropriate color change.   Complications: None .     Chest radiograph ordered and pending.   Comments: OGT placed via glidescope.    Darren Coleman, M.D.  Pulmonary & [place]

## 2020-07-22 NOTE — Progress Notes (Signed)
*  PRELIMINARY RESULTS* Echocardiogram 2D Echocardiogram has been performed.  Joanette Gula Isella Slatten 07/22/2020, 10:32 AM

## 2020-07-22 NOTE — Progress Notes (Signed)
Pt's blood glucose 69, pt on continuous bipap. Pt does have IV access, 12.5g of D50 to be admiinistered.

## 2020-07-22 NOTE — Progress Notes (Signed)
Pharmacy Antibiotic Note  Darren Coleman is a 70 y.o. male admitted on 08/13/2020 with pneumonia.  Pharmacy has been consulted for Vancomycin dosing.  Plan: Vancomycin 1250 mg IV X 1 ordered for 1/4 @ 2230.  Vancomycin 750 mg IV Q12H ordered to start on 1/5 @ 1100.  Will draw 1st Vanc trough on 1/6 @ 2230.   Height: 5\' 6"  (167.6 cm) Weight: 59 kg (130 lb 1.1 oz) IBW/kg (Calculated) : 63.8  Temp (24hrs), Avg:100.3 F (37.9 C), Min:98.6 F (37 C), Max:101.66 F (38.7 C)  Recent Labs  Lab 08/06/2020 1419 07/19/2020 1442 07/24/2020 1950 08/14/2020 2152 07/21/20 0530 07/21/20 0937 07/21/20 2336 07/22/20 0553 07/22/20 0556 07/22/20 1053  WBC 22.4*  --   --   --  14.2*  --   --  15.3*  --   --   CREATININE 0.52*  --  0.60*  --  0.69  --  0.84  --   --   --   LATICACIDVEN  --    < >  --  2.9* 2.6* 0.8  --   --  3.4* 3.9*   < > = values in this interval not displayed.    Estimated Creatinine Clearance: 71.2 mL/min (by C-G formula based on SCr of 0.84 mg/dL).    Allergies  Allergen Reactions  . Sulfa Antibiotics Rash    Antimicrobials this admission:   >>    >>   Dose adjustments this admission:   Microbiology results:  BCx:   UCx:    Sputum:    MRSA PCR:   Thank you for allowing pharmacy to be a part of this patient's care.  Robbins,Jason D 07/22/2020 9:36 PM

## 2020-07-22 NOTE — Progress Notes (Signed)
PHARMACY CONSULT NOTE - FOLLOW UP  Pharmacy Consult for Electrolyte Monitoring and Replacement   Recent Labs: Potassium (mmol/L)  Date Value  07/21/2020 4.2   Magnesium (mg/dL)  Date Value  48/07/6551 2.6 (H)   Calcium (mg/dL)  Date Value  74/82/7078 7.3 (L)   Albumin (g/dL)  Date Value  67/54/4920 2.0 (L)   Sodium (mmol/L)  Date Value  07/21/2020 125 (L)     Assessment: 70 year old male admitted with N/V/D and respiratory failure s/t CAP. Patient developed afib w/ RVR requiring amiodarone. He is also hypotensive on vasopressors and stress dose steroids. Pharmacy to manage electrolytes.  Goal of Therapy:  Electrolytes WNL, K ~ 4 and Mg ~ 2 with afib  Plan:  Electrolytes WNL today. Will check all electrolytes with morning labs.  Pricilla Riffle ,PharmD Clinical Pharmacist 07/22/2020 12:15 PM

## 2020-07-22 NOTE — Progress Notes (Signed)
Initial Nutrition Assessment  DOCUMENTATION CODES:   Severe malnutrition in context of chronic illness  INTERVENTION:   Ensure Enlive po TID, each supplement provides 350 kcal and 20 grams of protein  Magic cup TID with meals, each supplement provides 290 kcal and 9 grams of protein  MVI daily   Liberalize diet   Recommend Vitamin C 25m po BID to support wound healing  Pt at high refeed risk; recommend monitor potassium, magnesium and phosphorus labs daily as oral intake improves.   NUTRITION DIAGNOSIS:   Severe Malnutrition related to chronic illness (COPD, CHF) as evidenced by severe fat depletion,severe muscle depletion.  GOAL:   Patient will meet greater than or equal to 90% of their needs  MONITOR:   PO intake,Supplement acceptance,Labs,Weight trends,Skin,I & O's  REASON FOR ASSESSMENT:   Malnutrition Screening Tool    ASSESSMENT:   70y/o male with h/o COPD, CHF, HTN, hip fracture and iron deficiency anemia who is admitted with CAP, sepsis and Afib with RVR   Met with pt in room today. Pt sleeping and on bipap at time of RD visit so did not wake pt. Suspect pt with poor appetite and oral intake at baseline as pt is malnourished with multiple wounds. Per chart, pt with poor appetite and oral intake for 3-4 days pta r/t nausea and diarrhea. RD will add supplements and MVI to help pt meet his estimated needs. RD will also liberalize the heart healthy portion of pt's diet as this is restrictive of protein. Would recommend vitamin C supplementation to support wound healing. Pt is likely at high refeed risk. Palliative care consult pending.   Per chart, pt down 5lbs(4%) since August; RD unsure how recently weight loss occurred.   Medications reviewed and include: aspirin, colace, lovenox, pepcid, lasix, solu-cortef  Labs reviewed: Na 125(L)- 1/3 Wbc- 15.3(H) cbgs- 69, 124, 88 x 24 hrs  NUTRITION - FOCUSED PHYSICAL EXAM:  Flowsheet Row Most Recent Value  Orbital  Region Moderate depletion  Upper Arm Region Severe depletion  Thoracic and Lumbar Region Severe depletion  Buccal Region Moderate depletion  Temple Region Severe depletion  Clavicle Bone Region Severe depletion  Clavicle and Acromion Bone Region Severe depletion  Scapular Bone Region Severe depletion  Dorsal Hand Moderate depletion  Patellar Region Severe depletion  Anterior Thigh Region Severe depletion  Edema (RD Assessment) None  Hair Reviewed  Eyes Reviewed  Mouth Reviewed  Skin Reviewed  Nails Reviewed     Diet Order:   Diet Order            Diet Heart Room service appropriate? Yes; Fluid consistency: Thin  Diet effective now                EDUCATION NEEDS:   Not appropriate for education at this time  Skin:  Skin Assessment: Reviewed RN Assessment (Stage III vertebral column, Stage II buttocks, Stage I- L and R heels)  Last BM:  1/4- type 6  Height:   Ht Readings from Last 1 Encounters:  07/22/20 '5\' 6"'  (1.676 m)    Weight:   Wt Readings from Last 1 Encounters:  07/22/20 59 kg    Ideal Body Weight:  64.5 kg  BMI:  Body mass index is 20.99 kg/m.  Estimated Nutritional Needs:   Kcal:  1700-2000kcal/day  Protein:  85-100g/day  Fluid:  1.5-1.8L/day   CKoleen DistanceMS, RD, LDN Please refer to [facility]for RD and/or RD on-call/weekend/after hours pager

## 2020-07-22 NOTE — Consult Note (Addendum)
NAME: Darren Coleman  DOB: [birth year]  MRN: [id number]  Date/Time: 07/22/2020 12:57 PM  REQUESTING PROVIDER: Dr. Lanney Gins Subjective:  REASON FOR CONSULT: Sepsis ?No history available from patient as he is intubated.  Chart reviewed.  Care everywhere reviewed. Darren Coleman is a 70 y.o. male with a history of COPD on home oxygen,, colon cancer, CHF, atrial fibrillation, presented from by EMS on 08/11/2020 with worsening cough, shortness of breath, abdominal pain, nausea vomiting and diarrhea. Vitals in the ED was temperature 98.9, heart rate 117, BP 140/88, respiratory 14, sats 97% on 2 l oxygen. Labs revealed WBC of 22.4, Hb 13.8, platelet 124, creatinine 0.60.  Sodium 117, potassium 3.4, AST 48, lactate 2.8.  EKG reveals sinus tachycardia with right bundle branch block.  COVID-19 test was negative.  Sepsis protocol was initiated and patient received 1.5 L of Ringer lactate and started on azithromycin and ceftriaxone.  He was then admitted to the progressive care unit per hospitalist team.  But remained in the ED for bed availability.  On 07/21/2020 the patient developed atrial fibrillation with RVR along with hypotension.  He received 500 mL bolus and amiodarone bolus followed by continuous infusion.  Following interventions patient's heart rate improved.  However he remained hypotensive requiring new drip and transferred to the stepdown unit in the ICU.  But he still remained in the ED pending bed availability.  As his oxygen requirements increased to high flow nasal cannula along with nonrebreathing mask critical care team was consulted to assist with management on 07/21/2020.  Patient was admitted to the ICU.  And had to be intubated today for worsening respiratory failure.   CT abdomen and pelvis revealed small bilateral pleural effusion with airspace infiltration of the lung bases.  There was mild diffuse fatty infiltration of the liver.  The gallbladder was distended without stone or wall thickening.  There was no bile duct dilatation. Multiple stones were demonstrated in both kidneys.  There was no hydronephrosis or hydroureter.  .   I am asked to see the patient for management of any infection. On reviewing his chart and care everywhere he had a prolonged state at [facility] between 03/17/2020 until 04/01/2020 for a fall leading to multiple fractures including right proximal humerus comminuted open fracture managed conservatively, right superior and inferior pubic rami sacral fracture no intervention per Ortho, T8-T12 compression fracture age-indeterminate, L4-L5 compression fracture TLSO brace for comfort, questionable subarachnoid hemorrhage tiny and trace subdural was treated with 1 g Keppra repeat CT head negative,Retroperitoneal extraperitoneal bleeding around bladder iliacus.  He was also in A. fib RVR during that hospitalization he had dysphagia and after extensive conversation regarding nutrition feeding and swallowing it was found that patient was having silent aspiration with most fluids and pure.  Plan was to place [facility] for tube feeds.  But it was removed in 24 hours after patient's request. He was then transitioned to pured diet and then full diet and he was discharged to SNF.  On 05/10/2020 he went home.  He lives on his own but daughter and neighbor helps him.  He was getting home PT OT. June 2020 he had a fall with closed displaced 2 part intertrochanteric fracture of the right hip and underwent reduction and internal fixation  on 01/03/2019. He has osteoporosis secondary to chronic smoking and steroid dependency.  Past Medical History:  Diagnosis Date  . Atrial fibrillation with RVR ([place])   . Cancer ([place])   . COPD (chronic obstructive pulmonary disease) ([place])   .  Iron deficiency anemia   Colon cancer status post colectomy Past Surgical History:  Procedure Laterality Date  . COLON SURGERY    . INTRAMEDULLARY (IM) NAIL INTERTROCHANTERIC Right 01/03/2019   Procedure: INTRAMEDULLARY  (IM) NAIL INTERTROCHANTRIC;  Surgeon: Corky Mull, MD;  Location: [facility] ORS;  Service: Orthopedics;  Laterality: Right;    Social History   Socioeconomic History  . Marital status: Married    Spouse name: Not on file  . Number of children: Not on file  . Years of education: Not on file  . Highest education level: Not on file  Occupational History  . Not on file  Tobacco Use  . Smoking status: Current Every Day Smoker    Packs/day: 0.50    Types: Cigarettes  . Smokeless tobacco: Never Used  Substance and Sexual Activity  . Alcohol use: Yes    Comment: sporadic  . Drug use: Never  . Sexual activity: Not Currently  Other Topics Concern  . Not on file  Social History Narrative  . Not on file   Social Determinants of Health   Financial Resource Strain: Not on file  Food Insecurity: Not on file  Transportation Needs: Not on file  Physical Activity: Not on file  Stress: Not on file  Social Connections: Not on file  Intimate Partner Violence: Not on file    No family history on file. Allergies  Allergen Reactions  . Sulfa Antibiotics Rash    ? Current Facility-Administered Medications  Medication Dose Route Frequency Provider Last Rate Last Admin  . 0.9 %  sodium chloride infusion  250 mL Intravenous Continuous Sharion Settler, NP   Stopped at 07/21/20 [id number]  . 0.9 %  sodium chloride infusion  250 mL Intravenous Continuous Sharion Settler, NP   Stopped at 07/21/20 0913  . acetaminophen (TYLENOL) tablet 650 mg  650 mg Oral Q6H PRN Mansy, Jan A, MD       Or  . acetaminophen (TYLENOL) suppository 650 mg  650 mg Rectal Q6H PRN Mansy, Arvella Merles, MD   650 mg at 07/22/20 0617  . amiodarone (NEXTERONE) 1.8 mg/mL load via infusion 150 mg  150 mg Intravenous Once Sharion Settler, NP       Followed by  . amiodarone (NEXTERONE PREMIX) 360-4.14 MG/200ML-% (1.8 mg/mL) IV infusion  60 mg/hr Intravenous Continuous Awilda Bill, NP 33.3 mL/hr at 07/22/20 1134 60 mg/hr at 07/22/20 1134   . aspirin EC tablet 81 mg  81 mg Oral Daily Mansy, Jan A, MD   81 mg at 07/21/20 0935  . budesonide (PULMICORT) nebulizer solution 0.5 mg  0.5 mg Nebulization BID Awilda Bill, NP   0.5 mg at 07/22/20 0904  . cefTRIAXone (ROCEPHIN) 2 g in sodium chloride 0.9 % 100 mL IVPB  2 g Intravenous Q24H Mansy, Jan A, MD 200 mL/hr at 07/22/20 0622 2 g at 07/22/20 0622  . chlorhexidine (PERIDEX) 0.12 % solution 15 mL  15 mL Mouth Rinse BID Awilda Bill, NP   15 mL at 07/22/20 1255  . Chlorhexidine Gluconate Cloth 2 % PADS 6 each  6 each Topical Daily Awilda Bill, NP   6 each at 07/22/20 1115  . dextromethorphan-guaiFENesin (MUCINEX DM) 30-600 MG per 12 hr tablet 1 tablet  1 tablet Oral BID Mansy, Arvella Merles, MD   1 tablet at 07/21/20 0935  . docusate sodium (COLACE) capsule 100 mg  100 mg Oral BID Mansy, Jan A, MD      . doxycycline (VIBRAMYCIN)  100 mg in sodium chloride 0.9 % 250 mL IVPB  100 mg Intravenous Q12H Sharion Settler, NP 125 mL/hr at 07/22/20 1136 100 mg at 07/22/20 1136  . enoxaparin (LOVENOX) injection 40 mg  40 mg Subcutaneous Q24H Mansy, Jan A, MD   40 mg at 07/22/20 0030  . famotidine (PEPCID) tablet 40 mg  40 mg Oral Daily Mansy, Jan A, MD   40 mg at 07/21/20 [id number]  . furosemide (LASIX) injection 40 mg  40 mg Intravenous Daily Ottie Glazier, MD      . guaiFENesin (MUCINEX) 12 hr tablet 600 mg  600 mg Oral BID Mansy, Jan A, MD   600 mg at 07/21/20 0935  . hydrocortisone sodium succinate (SOLU-CORTEF) 100 MG injection 50 mg  50 mg Intravenous Q6H Awilda Bill, NP   50 mg at 07/22/20 0903  . levalbuterol (XOPENEX) nebulizer solution 0.63 mg  0.63 mg Nebulization Q6H PRN Sharion Settler, NP   0.63 mg at 07/21/20 1600  . levalbuterol (XOPENEX) nebulizer solution 0.63 mg  0.63 mg Nebulization Q6H Awilda Bill, NP   0.63 mg at 07/22/20 0904  . levalbuterol (XOPENEX) nebulizer solution 0.63 mg  0.63 mg Nebulization Q6H PRN Awilda Bill, NP      . magnesium hydroxide (MILK OF  MAGNESIA) suspension 30 mL  30 mL Oral Daily PRN Mansy, Jan A, MD      . MEDLINE mouth rinse  15 mL Mouth Rinse q12n4p Awilda Bill, NP      . midodrine (PROAMATINE) tablet 2.5 mg  2.5 mg Oral TID WC Awilda Bill, NP      . mupirocin ointment (BACTROBAN) 2 % 1 application  1 application Nasal BID Nicole Kindred A, DO   1 application at [id number]  . ondansetron (ZOFRAN) tablet 4 mg  4 mg Oral Q6H PRN Mansy, Jan A, MD   4 mg at 08/03/2020 2251   Or  . ondansetron [facility]) injection 4 mg  4 mg Intravenous Q6H PRN Mansy, Jan A, MD      . oxyCODONE-acetaminophen (PERCOCET/ROXICET) 5-325 MG per tablet 1 tablet  1 tablet Oral Q4H PRN Mansy, Arvella Merles, MD   1 tablet at 07/21/20 1433  . phenylephrine (NEOSYNEPHRINE) 10-0.9 MG/250ML-% infusion  25-200 mcg/min Intravenous Titrated Awilda Bill, NP 45 mL/hr at 07/22/20 1202 30 mcg/min at 07/22/20 1202  . tiZANidine (ZANAFLEX) tablet 4 mg  4 mg Oral Q6H PRN Mansy, Jan A, MD      . traZODone (DESYREL) tablet 25 mg  25 mg Oral QHS PRN Mansy, Jan A, MD      . vasopressin (PITRESSIN) 20 Units in sodium chloride 0.9 % 100 mL infusion-*FOR SHOCK*  0-0.04 Units/min Intravenous Continuous Awilda Bill, NP 12 mL/hr at 07/22/20 0600 0.04 Units/min at 07/22/20 0600     Abtx:  Anti-infectives (From admission, onward)   Start     Dose/Rate Route Frequency Ordered Stop   07/22/20 2000  vancomycin (VANCOREADY) IVPB 750 mg/150 mL  Status:  Discontinued        750 mg 150 mL/hr over 60 Minutes Intravenous Every 12 hours 07/22/20 0612 07/22/20 1208   07/22/20 0715  vancomycin (VANCOREADY) IVPB 1250 mg/250 mL        1,250 mg 166.7 mL/hr over 90 Minutes Intravenous  Once 07/22/20 0610 07/22/20 1032   07/21/20 0900  doxycycline (VIBRAMYCIN) 100 mg in sodium chloride 0.9 % 250 mL IVPB        100 mg 125  mL/hr over 120 Minutes Intravenous Every 12 hours 07/21/20 0502     07/21/20 0700  cefTRIAXone (ROCEPHIN) 2 g in sodium chloride 0.9 % 100 mL IVPB        2  g 200 mL/hr over 30 Minutes Intravenous Every 24 hours 07/21/2020 2214 07/25/20 0659   07/26/2020 2200  cefTRIAXone (ROCEPHIN) 2 g in sodium chloride 0.9 % 100 mL IVPB  Status:  Discontinued        2 g 200 mL/hr over 30 Minutes Intravenous Every 24 hours 07/30/2020 1943 08/01/2020 2214   08/16/2020 2200  azithromycin (ZITHROMAX) 500 mg in sodium chloride 0.9 % 250 mL IVPB  Status:  Discontinued        500 mg 250 mL/hr over 60 Minutes Intravenous Every 24 hours 07/21/2020 1943 07/21/20 0502   07/24/2020 1630  ceFEPIme (MAXIPIME) 2 g in sodium chloride 0.9 % 100 mL IVPB        2 g 200 mL/hr over 30 Minutes Intravenous  Once 07/22/2020 1629 07/28/2020 1731      REVIEW OF SYSTEMS:   NA: Objective:  VITALS:  BP 94/76   Pulse (!) 107   Temp 99.32 F (37.4 C)   Resp (!) 23   Ht 5\' 6"  (1.676 m)   Wt 59 kg   SpO2 96%   BMI 20.99 kg/m  PHYSICAL EXAM:  General: Intubated and sedated Head: Normocephalic, without obvious abnormality, atraumatic. Eyes: Conjunctivae clear, anicteric sclerae. Pupils are equal ENT cannot examine Neck: Symmetrical no carotid bruit and no JVD. Back: As per his nurse there is a stage II decub on the sacrum and lower spine and bilateral heels.. Lungs: Bilateral air entry rhonchi Heart: Irregular heart rate Abdomen: Soft,  Extremities: atraumatic, no cyanosis. No edema. No clubbing Skin:  bruising Lymph: Cervical, supraclavicular normal. Neurologic: Cannot be examined l Pertinent Labs Lab Results CBC    Component Value Date/Time   WBC 15.3 (H) 07/22/2020 0553   RBC 3.35 (L) 07/22/2020 0553   HGB 11.1 (L) 07/22/2020 0553   HCT 33.6 (L) 07/22/2020 0553   PLT 113 (L) 07/22/2020 0553   MCV 100.3 (H) 07/22/2020 0553   MCH 33.1 07/22/2020 0553   MCHC 33.0 07/22/2020 0553   RDW 20.3 (H) 07/22/2020 0553   LYMPHSABS 0.3 (L) 07/22/2020 0553   MONOABS 1.6 (H) 07/22/2020 0553   EOSABS 0.1 07/22/2020 0553   BASOSABS 0.0 07/22/2020 0553    CMP Latest Ref Rng & Units  07/21/2020 07/21/2020 08/01/2020  Glucose 70 - 99 mg/dL 88 96 97  BUN 8 - 23 mg/dL 23 16 14   Creatinine 0.61 - 1.24 mg/dL 0.84 0.69 0.60(L)  Sodium 135 - 145 mmol/L 125(L) 124(L) 120(L)  Potassium 3.5 - 5.1 mmol/L 4.2 3.3(L) 3.0(L)  Chloride 98 - 111 mmol/L 94(L) 87(L) 83(L)  CO2 22 - 32 mmol/L 20(L) 28 26  Calcium 8.9 - 10.3 mg/dL 7.3(L) 7.7(L) 7.8(L)  Total Protein 6.5 - 8.1 g/dL 4.7(L) 4.9(L) -  Total Bilirubin 0.3 - 1.2 mg/dL 1.1 1.7(H) -  Alkaline Phos 38 - 126 U/L 66 55 -  AST 15 - 41 U/L 44(H) 43(H) -  ALT 0 - 44 U/L 16 15 -      Microbiology: Recent Results (from the past 240 hour(s))  Urine culture     Status: Abnormal   Collection Time: 07/31/2020  4:47 PM   Specimen: In/Out Cath Urine  Result Value Ref Range Status   Specimen Description   Final    IN/OUT  CATH URINE Performed at [facility], [address] [place], [place] [address]    Special Requests   Final    NONE Performed at [facility], [address] [place], [place] [address]    Culture (A)  Final    <10,000 COLONIES/mL INSIGNIFICANT GROWTH Performed at [facility], [address] [place], [place] [address]    Report Status 07/22/2020 FINAL  Final  Blood culture (routine x 2)     Status: None (Preliminary result)   Collection Time: 26-Jul-2020  4:47 PM   Specimen: BLOOD  Result Value Ref Range Status   Specimen Description   Final    BLOOD Performed at [facility], [address] [place], [place] [address]    Special Requests   Final    NONE Performed at [facility], [address], [place], [place] [address]    Culture  Setup Time   Final    GRAM POSITIVE COCCI AEROBIC BOTTLE ONLY CRITICAL RESULT CALLED TO, READ BACK BY AND VERIFIED WITH: JASON ROBINS AT 1825 ON 07/21/20 BY SS Performed at [facility], [address], [place], [place] [address]    Culture GRAM POSITIVE COCCI  Final   Report Status PENDING  Incomplete  Blood culture (routine  x 2)     Status: None (Preliminary result)   Collection Time: Jul 26, 2020  4:47 PM   Specimen: BLOOD  Result Value Ref Range Status   Specimen Description BLOOD RIGHT ANTECUBITAL  Final   Special Requests   Final    BOTTLES DRAWN AEROBIC AND ANAEROBIC Blood Culture results may not be optimal due to an inadequate volume of blood received in culture bottles   Culture   Final    NO GROWTH 2 DAYS Performed at [facility], [address] [place], [place] [address]    Report Status PENDING  Incomplete  Resp Panel by RT-PCR (Flu A&B, Covid) Nasopharyngeal Swab     Status: None   Collection Time: 2020/07/26  4:47 PM   Specimen: Nasopharyngeal Swab; Nasopharyngeal(NP) swabs in vial transport medium  Result Value Ref Range Status   SARS Coronavirus 2 by RT PCR NEGATIVE NEGATIVE Final    Comment: (NOTE) SARS-CoV-2 target nucleic acids are NOT DETECTED.  The SARS-CoV-2 RNA is generally detectable in upper respiratory specimens during the acute phase of infection. The lowest concentration of SARS-CoV-2 viral copies this assay can detect is 138 copies/mL. A negative result does not preclude SARS-Cov-2 infection and should not be used as the sole basis for treatment or other patient management decisions. A negative result may occur with  improper specimen collection/handling, submission of specimen other than nasopharyngeal swab, presence of viral mutation(s) within the areas targeted by this assay, and inadequate number of viral copies(<138 copies/mL). A negative result must be combined with clinical observations, patient history, and epidemiological information. The expected result is Negative.  Fact Sheet for Patients:  BloggerCourse.com  Fact Sheet for Healthcare Providers:  SeriousBroker.it  This test is no t yet approved or cleared by the [place] FDA and  has been authorized for detection and/or diagnosis of SARS-CoV-2  by FDA under an Emergency Use Authorization (EUA). This EUA will remain  in effect (meaning this test can be used) for the duration of the COVID-19 declaration under Section 564(b)(1) of the Act, 21 U.S.C.section 360bbb-3(b)(1), unless the authorization is terminated  or revoked sooner.       Influenza A by PCR NEGATIVE NEGATIVE  Final   Influenza B by PCR NEGATIVE NEGATIVE Final    Comment: (NOTE) The Xpert Xpress SARS-CoV-2/FLU/RSV plus assay is intended as an aid in the diagnosis of influenza from Nasopharyngeal swab specimens and should not be used as a sole basis for treatment. Nasal washings and aspirates are unacceptable for Xpert Xpress SARS-CoV-2/FLU/RSV testing.  Fact Sheet for Patients: EntrepreneurPulse.com.au  Fact Sheet for Healthcare Providers: IncredibleEmployment.be  This test is not yet approved or cleared by the [place] FDA and has been authorized for detection and/or diagnosis of SARS-CoV-2 by FDA under an Emergency Use Authorization (EUA). This EUA will remain in effect (meaning this test can be used) for the duration of the COVID-19 declaration under Section 564(b)(1) of the Act, 21 U.S.C. section 360bbb-3(b)(1), unless the authorization is terminated or revoked.  Performed at [facility], [address] [place], [place] [address]   Blood Culture ID Panel (Reflexed)     Status: Abnormal   Collection Time: 08/12/2020  4:47 PM  Result Value Ref Range Status   Enterococcus faecalis NOT DETECTED NOT DETECTED Final   Enterococcus Faecium NOT DETECTED NOT DETECTED Final   Listeria monocytogenes NOT DETECTED NOT DETECTED Final   Staphylococcus species DETECTED (A) NOT DETECTED Final    Comment: CRITICAL RESULT CALLED TO, READ BACK BY AND VERIFIED WITH: JASON ROBINS AT [id number] ON 07/21/20 BY SS    Staphylococcus aureus (BCID) NOT DETECTED NOT DETECTED Final   Staphylococcus epidermidis NOT DETECTED NOT DETECTED  Final   Staphylococcus lugdunensis NOT DETECTED NOT DETECTED Final   Streptococcus species NOT DETECTED NOT DETECTED Final   Streptococcus agalactiae NOT DETECTED NOT DETECTED Final   Streptococcus pneumoniae NOT DETECTED NOT DETECTED Final   Streptococcus pyogenes NOT DETECTED NOT DETECTED Final   A.calcoaceticus-baumannii NOT DETECTED NOT DETECTED Final   Bacteroides fragilis NOT DETECTED NOT DETECTED Final   Enterobacterales NOT DETECTED NOT DETECTED Final   Enterobacter cloacae complex NOT DETECTED NOT DETECTED Final   Escherichia coli NOT DETECTED NOT DETECTED Final   Klebsiella aerogenes NOT DETECTED NOT DETECTED Final   Klebsiella oxytoca NOT DETECTED NOT DETECTED Final   Klebsiella pneumoniae NOT DETECTED NOT DETECTED Final   Proteus species NOT DETECTED NOT DETECTED Final   Salmonella species NOT DETECTED NOT DETECTED Final   Serratia marcescens NOT DETECTED NOT DETECTED Final   Haemophilus influenzae NOT DETECTED NOT DETECTED Final   Neisseria meningitidis NOT DETECTED NOT DETECTED Final   Pseudomonas aeruginosa NOT DETECTED NOT DETECTED Final   Stenotrophomonas maltophilia NOT DETECTED NOT DETECTED Final   Candida albicans NOT DETECTED NOT DETECTED Final   Candida auris NOT DETECTED NOT DETECTED Final   Candida glabrata NOT DETECTED NOT DETECTED Final   Candida krusei NOT DETECTED NOT DETECTED Final   Candida parapsilosis NOT DETECTED NOT DETECTED Final   Candida tropicalis NOT DETECTED NOT DETECTED Final   Cryptococcus neoformans/gattii NOT DETECTED NOT DETECTED Final    Comment: Performed at [facility], [address] [place], [place] [address]  Culture, sputum-assessment     Status: None   Collection Time: 07/24/2020  7:50 PM   Specimen: Expectorated Sputum  Result Value Ref Range Status   Specimen Description EXPSU  Final   Special Requests NONE  Final   Sputum evaluation   Final    THIS SPECIMEN IS ACCEPTABLE FOR SPUTUM CULTURE Performed at [facility], [address], [place], [place] [address]    Report Status 07/27/2020 FINAL  Final  Culture, respiratory  Status: None (Preliminary result)   Collection Time: 08/02/2020  7:50 PM  Result Value Ref Range Status   Specimen Description   Final    EXPSU Performed at [facility], [address] [place], [place] [address]    Special Requests   Final    NONE Reflexed from [id number] Performed at [facility], [address] [place], [place] [address]    Gram Stain   Final    RARE WBC PRESENT, PREDOMINANTLY PMN ABUNDANT GRAM POSITIVE COCCI IN CHAINS IN CLUSTERS    Culture   Final    ABUNDANT STAPHYLOCOCCUS AUREUS CULTURE REINCUBATED FOR BETTER GROWTH Performed at [facility], [place] [address], [place], [place] [address]    Report Status PENDING  Incomplete  Gastrointestinal Panel by PCR , Stool     Status: None   Collection Time: 07/21/20  9:42 AM   Specimen: Stool  Result Value Ref Range Status   Campylobacter species NOT DETECTED NOT DETECTED Final   Plesimonas shigelloides NOT DETECTED NOT DETECTED Final   Salmonella species NOT DETECTED NOT DETECTED Final   Yersinia enterocolitica NOT DETECTED NOT DETECTED Final   Vibrio species NOT DETECTED NOT DETECTED Final   Vibrio cholerae NOT DETECTED NOT DETECTED Final   Enteroaggregative E coli (EAEC) NOT DETECTED NOT DETECTED Final   Enteropathogenic E coli (EPEC) NOT DETECTED NOT DETECTED Final   Enterotoxigenic E coli (ETEC) NOT DETECTED NOT DETECTED Final   Shiga like toxin producing E coli (STEC) NOT DETECTED NOT DETECTED Final   Shigella/Enteroinvasive E coli (EIEC) NOT DETECTED NOT DETECTED Final   Cryptosporidium NOT DETECTED NOT DETECTED Final   Cyclospora cayetanensis NOT DETECTED NOT DETECTED Final   Entamoeba histolytica NOT DETECTED NOT DETECTED Final   Giardia lamblia NOT DETECTED NOT DETECTED Final   Adenovirus F40/41 NOT DETECTED NOT DETECTED Final   Astrovirus NOT DETECTED NOT  DETECTED Final   Norovirus GI/GII NOT DETECTED NOT DETECTED Final   Rotavirus A NOT DETECTED NOT DETECTED Final   Sapovirus (I, II, IV, and V) NOT DETECTED NOT DETECTED Final    Comment: Performed at [facility], [address] [place], [place] [address]  C Difficile Quick Screen w PCR reflex     Status: None   Collection Time: 07/21/20  9:42 AM  Result Value Ref Range Status   C Diff antigen NEGATIVE NEGATIVE Final   C Diff toxin NEGATIVE NEGATIVE Final   C Diff interpretation No C. difficile detected.  Final    Comment: Performed at [facility], [address] [place], [place] [address]  MRSA PCR Screening     Status: Abnormal   Collection Time: 07/21/20 11:25 PM   Specimen: Nasopharyngeal  Result Value Ref Range Status   MRSA by PCR POSITIVE (A) NEGATIVE Final    Comment:        The GeneXpert MRSA Assay (FDA approved for NASAL specimens only), is one component of a comprehensive MRSA colonization surveillance program. It is not intended to diagnose MRSA infection nor to guide or monitor treatment for MRSA infections. RESULT CALLED TO, READ BACK BY AND VERIFIED WITH: TY Martinique RN 0127 07/22/20 HNM Performed at [facility], [address] [place], [place] [address]    Blood culture 1 of 4 staph species. MRSA nares positive  IMAGING RESULTS:  Small bilateral pleural effusions with airspace infiltration in the lung bases, likely pneumonia. This may represent community-acquired pneumonia. COVID pneumonia does not typically involve pleural effusions. 2.  Mild diffuse fatty infiltration of the liver. 3. Multiple nonobstructing stones in both kidneys. No hydronephrosis or hydroureter. 4. Bladder wall thickening is new since the prior study, likely indicating cystitis versus outlet obstruction. I have personally reviewed the films ? Impression/Recommendation ? ?Acute on chronic hypoxic respiratory failure.  With underlying COPD and now has  CHF and A. fib.  Has been intubated.  Bilateral pulmonary infiltrates and pleural effusions. Left > rt effusion   Being treated as pneumonia.  On ceftriaxone and doxycycline.  Recommend thoracentesis of the left side for diagnostic and therapeutic purpose and sending for cultures to make sure it is not an empyema.  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 We will add vancomycin instead of doxycycline.  Linezolid is not an option in his case because of multiple  drugs that can cause serotonin syndrome.  Septic shock on pressors due to the above condition. Afib with RVR, chf  COPD has been on chronic steroids. 74.  Currently on hydrocortisone. May have adrenal insufficiency as he came in with hyponatremia.  Chronic debility  History of multiple falls The last 1 in August left him with multiple fractures as listed above and he was in [place] and rehab for nearly 2 months.  Chronic smoker Thrombocytopenia since admission.  _Discussed the management with his nurse and care team. Note:  This document was prepared using Dragon voice recognition software and may include unintentional dictation errors.

## 2020-07-23 LAB — GLUCOSE, CAPILLARY
Glucose-Capillary: 17 mg/dL — CL (ref 70–99)
Glucose-Capillary: <10 mg/dL — CL (ref 70–99)

## 2020-07-23 LAB — CULTURE, BLOOD (ROUTINE X 2)

## 2020-07-23 LAB — LEGIONELLA PNEUMOPHILA SEROGP 1 UR AG: L. pneumophila Serogp 1 Ur Ag: NEGATIVE

## 2020-07-23 LAB — STREP PNEUMONIAE URINARY ANTIGEN: Strep Pneumo Urinary Antigen: NEGATIVE

## 2020-07-23 MED FILL — Medication: Qty: 2 | Status: AC

## 2020-07-24 LAB — CULTURE, RESPIRATORY W GRAM STAIN

## 2020-07-24 LAB — GLUCOSE, CAPILLARY
Glucose-Capillary: <10 mg/dL — CL (ref 70–99)
Glucose-Capillary: <10 mg/dL — CL (ref 70–99)

## 2020-07-25 LAB — CULTURE, BLOOD (ROUTINE X 2): Culture: NO GROWTH

## 2020-08-19 NOTE — Progress Notes (Signed)
Pt became tachycardia, which rapidly progressed to bradycardia staff nurse at bedside and Code blue was called and  CPR and ACLS were begun immediately. See code blue sheet for details.   During code Chaplin called Daughter and daughter requested to stop code, pt was made DNR/DNI and regain pulse for few minutes and late progressed to asystole, NP Pronounced pt dead at 01:31am.  Darren Coleman escorted daughter to bedside and daughter was allowed to spend sometime with the pt.

## 2020-08-19 NOTE — Progress Notes (Signed)
Chaplain spoke to daughter, Mickel Baas during Code Blue. Mickel Baas indicated she desired her father to be DNR. This was communicated to NP and CPR was stopped. Mickel Baas arrived at hospital. Chaplain met her at during, supported her while she spent time with her father and walked her back out. She expressed complex grief related to the death of her mother in 10-06-18 and now her father. Prayer was offered releasing Simona Huh and for Mickel Baas. She desired education on how to share with her 60 year old daughter. Chaplain explored with her ways to engage with her daughter. Daughter anticipates creation and will be using [place].     08-08-2020 0200  Clinical Encounter Type  Visited With Patient;Family  Visit Type Critical Care;Death  Referral From Nurse  Spiritual Encounters  Spiritual Needs Emotional;Grief support

## 2020-08-19 NOTE — Significant Event (Signed)
CODE BLUE DOCUMENTATION  00:56 Code Blue was called by nursing.  Nursing reports that pt became tachycardia, which rapidly progressed to bradycardia and then Asystole.  CPR and ACLS were begun immediately.  Pt received multiple rounds of EPI, Bicarb, Glucose.  Also received 1g Calcium. Upon pulse checks, pt remained asystole, therefore no defibrillation was indicated. See CODE BLUE sheet for full details.  1:11 ROSC obtained, but with weak thready pulse. Chaplain able to contact the pt's daughter who requests that all resuscitative efforts be stopped.  Pt made DNR/DNI.  Daughter is en route to hospital, pt will likely expire in the very near future.     Harlon Ditty, AGACNP-BC [facility] Pager: [phone]

## 2020-08-19 NOTE — Progress Notes (Signed)
eLink Physician-Brief Progress Note Patient Name: Darren Coleman DOB: 12/30/1950 MRN: [id number]   Date of Service  08-16-2020  HPI/Events of Note  I responded to a  CODE Blue. On arrival in the room via camera CPR was in progress, APP  Harlon Ditty was in the room running the code.  eICU Interventions  I made  Suggestions. Shortly after ROSC was obtained, the daughter was successfully contacted by the hospital chaplain and requested no further resuscitative attempts and DNR status.        [name] 08-16-20, 2:27 AM

## 2020-08-19 NOTE — Consult Note (Signed)
ANTICOAGULATION CONSULT NOTE - Follow Up Consult  Pharmacy Consult for Heparin infusion Indication: atrial fibrillation  Allergies  Allergen Reactions  . Sulfa Antibiotics Rash    Patient Measurements: Height: 5\' 6"  (167.6 cm) Weight: 59 kg (130 lb 1.1 oz) IBW/kg (Calculated) : 63.8 Heparin Dosing Weight: 59 kg  Vital Signs: Temp: 96.98 F (36.1 C) (01/04 2330) BP: 92/71 (01/04 2330)  Labs: Recent Labs    08/13/2020 1419 08/06/2020 1647 08/08/2020 1815 08/12/2020 1950 07/21/20 0530 07/21/20 2336 07/22/20 0553 07/22/20 2312  HGB 13.8  --   --   --  12.7*  --  11.1*  --   HCT 39.3  --   --   --  37.3*  --  33.6*  --   PLT 124*  --   --   --  102*  --  113*  --   APTT  --  37*  --   --   --   --   --   --   LABPROT  --  14.4  --   --   --   --   --   --   INR  --  1.2  --   --   --   --   --   --   HEPARINUNFRC  --   --   --   --   --   --   --  0.26*  CREATININE 0.52*  --   --  0.60* 0.69 0.84  --   --   TROPONINIHS 18*  --  30*  --   --   --   --   --     Estimated Creatinine Clearance: 71.2 mL/min (by C-G formula based on SCr of 0.84 mg/dL).   Medications:  Enoxaparin 40 mg SQ q24h  -- last dose 01/04 0030  Assessment: 70 yo male admitted with nausea/vomiting/diarrhea and acute on chronic hypoxic respiratory failure secondary to CAP developed atrial fibrillation with rvr along with hypotension requiring amiodarone gtt, vasopressors, and intubation. Received 2 doses of prophylactic enoxaparin since admission.   0104 2312 HL 0.26, SUBtherapeutic.  Will increase Heparin infusion to 1000 units/hr and recheck HL in 6 hrs   Goal of Therapy:  Heparin level 0.3-0.7 units/ml Monitor platelets by anticoagulation protocol: Yes   Plan:   Increase heparin infusion to 1000 units/hr  Check anti-Xa (Heparin) level in 6 hours and daily while on heparin   Continue to monitor H&H and platelets daily   81, PharmD Clinical Pharmacist  July 31, 2020,1:03 AM

## 2020-08-19 NOTE — Death Summary Note (Signed)
DEATH SUMMARY   Patient Details  Name: Darren Coleman MRN: [id number] DOB: 10-26-1950  Admission/Discharge Information   Admit Date:  08/17/2020  Date of Death:  07/26/2020  Time of Death:  01:30  Length of Stay: 3  Referring Physician: Dorita Fray, MD   Reason(s) for Hospitalization  Septic shock Community Acquired Pneumonia Acute on Chronic Hypoxic Respiratory Failure Pulmonary Edema Atrial fibrillation with RVR GERD  Diagnoses  Preliminary cause of death:   Septic Shock Secondary Diagnoses (including complications and co-morbidities):  Active Problems:   Pressure injury of skin   CAP (community acquired pneumonia)   Pneumonia   Protein-calorie malnutrition, severe Pulmonary Edema Atrial fibrillation with RVR GERD  Brief Hospital Course (including significant findings, care, treatment, and services provided and events leading to death)  Darren Coleman is a 70 y.o. year old male who presented to [facility] ER on 07-23-22 from home via EMS with c/o worsening cough, shortness of breath, abdominal pain, n/v, and diarrhea.  Upon arrival to the ER pts O2 sats were 99% on his chronic home O2 @2L  via nasal canula, however he was noted to be tachycardic with hr 133.  Lab results revealed Na+ 117, K+ 3.4, chloride 76, calcium 7.7, AST 48, lactic acid 2.8, pct 2.58, wbc 22.4, platelets 124, UA negative for UTI, and vbg pH 7.42/pCO2 46/bicarb 30.5.  EKG revealed sinus tachycardia with a right BBB. COVID-19/Influenza PCR negative, however CXR concerning for pneumonia.  Sepsis protocol initiated pt received 1.5L LR bolus, azithromycin, and ceftriaxone.  He was subsequently admitted to the progressive care unit per hospitalist team for additional workup and treatment, but remained in the ER pending bed availability. On 01/3 the pt developed atrial fibrillation with rvr along with hypotension.  He received 500 ml bolus and amiodarone bolus followed by continuous infusion.  Following interventions  pts hr improved, however he remained hypotensive requiring neo-synephrine gtt and transfer to the stepdown unit, but remained in the ER pending bed availability.  On 01/3 pts hypotension persisted requiring increased vasopressor requirement and O2 requirements increased to HFNC along with NRB, therefore PCCM team consulted to assist with management.    On 07/22/20, he progressively declined including: worsening hypoxia (O2 sats <80%) despite BiPAP and worsening septic shock requiring 2 vasopressors.  He required emergent intubation and placement of Left IJ central line.  Early in the morning on 07-26-20 he suddenly became tachycardia which rapidly progressed to bradycardia and asystole.  CODE BLUE was called, and CPR and ACLS were begun immediately.  He received multiple rounds of EPI, bicarb, Glucose, along with Calcium.  Was able to obtain ROSC (weak thready pulse).  At the same time ROSC obtained, was able to reach pt's daughter, she requested that all resuscitative efforts be stopped, and he be made DNR/DNI. Pt remained on vent and vasopressors, however shortly thereafter he expired.   Pertinent Labs and Studies  Significant Diagnostic Studies DG Chest 1 View  Result Date: 07/22/2020 CLINICAL DATA:  Intubated EXAM: CHEST  1 VIEW COMPARISON:  07/21/2020, Jul 23, 2020 FINDINGS: Endotracheal tube tip is about 4.7 cm superior to the carina. Esophageal tube is below the diaphragm but incompletely visualized. Small moderate bilateral pleural effusions, likely increased compared to exams from several days prior. Airspace infiltrate in the left upper lobe with bibasilar consolidations as before. Hazy left greater than right lung opacity without great change since 07/21/2020, may reflect component of layering pleural effusion and underlying airspace disease. Cardiomediastinal silhouette stable. Aortic atherosclerosis. Emphysematous disease. IMPRESSION:  1. Endotracheal tube tip about 4.7 cm superior to the carina.  Esophageal tube below diaphragm, incompletely visualized. 2. Bilateral pleural effusions, small to moderate, likely increased compared to exams from several days prior. 3. Airspace disease in the left upper lobe and bilateral lung bases. Left greater than right hazy lung opacification, not significantly changed since 07/21/2020, increased compared with radiographs performed prior to this. Electronically Signed   By: Donavan Foil M.D.   On: 07/22/2020 15:39   DG Chest 1 View  Result Date: 07/21/2020 CLINICAL DATA:  Tachycardia EXAM: CHEST  1 VIEW COMPARISON:  Yesterday FINDINGS: Airspace disease with hazy opacity at both bases and airspace disease in the left suprahilar lung. There is background emphysema. Stable heart size and mediastinal contours distorted by rotation and aortic elongation. IMPRESSION: Bilateral pneumonia with worsening opacity at the bases. Electronically Signed   By: Monte Fantasia M.D.   On: 07/21/2020 05:27   DG Chest 1 View  Result Date: 07/21/2020 CLINICAL DATA:  Short of breath with cough and fever EXAM: CHEST  1 VIEW COMPARISON:  01/08/2019 chest x-ray.  Chest CT 03/17/2020 FINDINGS: Interval development of left upper lobe patchy airspace disease. Eventration right hemidiaphragm unchanged. Right lung clear. No effusion or heart failure. Chronic fracture right humeral neck IMPRESSION: Interval development of left upper lobe airspace disease most likely pneumonia. Electronically Signed   By: Franchot Gallo M.D.   On: 07/29/2020 18:15   DG Abd 1 View  Result Date: 07/22/2020 CLINICAL DATA:  OG tube placement EXAM: ABDOMEN - 1 VIEW COMPARISON:  05/21/2020, CT 08/18/2020 FINDINGS: Esophageal tube tip and side port overlie the proximal stomach. Mild central gaseous dilatation of bowel. Partially visualized right hip hardware. IMPRESSION: Esophageal tube tip overlies the proximal stomach. Mild gaseous prominence of central bowel loops without definitive obstructive pattern  Electronically Signed   By: Donavan Foil M.D.   On: 07/22/2020 15:35   CT ABDOMEN PELVIS W CONTRAST  Result Date: 08/07/2020 CLINICAL DATA:  Acute abdominal pain. Cough, congestion, fever, and headache. EXAM: CT ABDOMEN AND PELVIS WITH CONTRAST TECHNIQUE: Multidetector CT imaging of the abdomen and pelvis was performed using the standard protocol following bolus administration of intravenous contrast. CONTRAST:  154mL OMNIPAQUE IOHEXOL 300 MG/ML  SOLN COMPARISON:  05/21/2020 FINDINGS: Lower chest: Small bilateral pleural effusions with airspace infiltration in the lung bases, likely pneumonia. This may represent community-acquired pneumonia. COVID pneumonia does not typically involve pleural effusions. Hepatobiliary: Mild diffuse fatty infiltration of the liver. No focal liver lesions. The gallbladder is distended without stone or wall thickening identified. No bile duct dilatation. Pancreas: Unremarkable. No pancreatic ductal dilatation or surrounding inflammatory changes. Spleen: Normal in size without focal abnormality. Adrenals/Urinary Tract: Multiple stones demonstrated in both kidneys. Nephrograms are symmetrical. No hydronephrosis or hydroureter. No ureteral stones are identified. Bladder wall is diffusely thickened, likely indicating cystitis versus outlet obstruction. Bladder wall thickening is new since the prior study. Stomach/Bowel: Stomach, small bowel, and colon are not abnormally distended. Scattered stool in the colon. No obvious inflammatory changes although under distention limits evaluation. The appendix is normal. Vascular/Lymphatic: Tortuous and calcified aorta. No significant lymphadenopathy. Reproductive: Prostate is unremarkable. Other: No free air or free fluid in the abdomen. Abdominal wall musculature appears intact. Musculoskeletal: Diffuse bone demineralization with multiple lumbar vertebral compression deformities. Previous internal fixation of the right hip. Old right pelvic  fractures. Changes likely to represent osteoporosis. IMPRESSION: 1. Small bilateral pleural effusions with airspace infiltration in the lung bases, likely pneumonia. This may  represent community-acquired pneumonia. COVID pneumonia does not typically involve pleural effusions. 2. Mild diffuse fatty infiltration of the liver. 3. Multiple nonobstructing stones in both kidneys. No hydronephrosis or hydroureter. 4. Bladder wall thickening is new since the prior study, likely indicating cystitis versus outlet obstruction. 5. Diffuse bone demineralization with multiple lumbar vertebral compression deformities. Old right pelvic fractures. Changes likely to represent osteoporosis. 6. Aortic atherosclerosis. Aortic Atherosclerosis (ICD10-I70.0). Electronically Signed   By: Lucienne Capers M.D.   On: 08/05/2020 18:44   DG Chest Port 1 View  Result Date: 07-26-2020 CLINICAL DATA:  Central line placement EXAM: PORTABLE CHEST 1 VIEW COMPARISON:  07/22/2020 FINDINGS: Left internal jugular central venous catheter tip is seen within the expected superior vena cava. Endotracheal tube and nasogastric tube extending into the upper abdomen beyond the margin of the examination are unchanged. Extensive consolidation within the a left upper lung zone appears stable when accounting for changes in patient positioning. Moderate bilateral pleural effusions are again identified, accounting for bibasilar opacification. Increasing right basilar collapse no pneumothorax. Cardiac size within normal limits. Pulmonary vascularity is within normal limits. IMPRESSION: Left internal jugular central venous catheter tip within the superior vena cava. Otherwise stable support tubes. Stable left upper lobe consolidation. Stable bilateral moderate pleural effusions. Increasing right basilar collapse. Electronically Signed   By: Fidela Salisbury MD   On: July 26, 2020 00:11   DG Chest Port 1 View  Result Date: 07/21/2020 CLINICAL DATA:  Acute respiratory  failure, hypoxia EXAM: PORTABLE CHEST 1 VIEW COMPARISON:  07/21/2020 at 5 a.m. FINDINGS: Single frontal view of the chest demonstrates a stable cardiac silhouette. There is progressive bilateral lung consolidation. On the left, the pattern is most consistent with worsening airspace disease. On the right, there is significant consolidation of the right lower lobe which may reflect a combination of atelectasis and airspace disease. Small bilateral effusions cannot be excluded. No pneumothorax. Prominent skin fold overlying the right apex. No acute bony abnormalities. IMPRESSION: 1. Worsening bilateral lung consolidation, consistent with progressive airspace disease and atelectasis. Findings could reflect worsening pneumonia or edema. Electronically Signed   By: Randa Ngo M.D.   On: 07/21/2020 20:38   ECHOCARDIOGRAM COMPLETE  Result Date: 07/22/2020    ECHOCARDIOGRAM REPORT   Patient Name:   Darren Coleman Date of Exam: 07/22/2020 Medical Rec #:  [id number]       Height:       66.0 in Accession #:    [id number]      Weight:       130.1 lb Date of Birth:  [birth year]        BSA:          1.666 m Patient Age:    75 years        BP:           94/76 mmHg Patient Gender: M               HR:           105 bpm. Exam Location:  [facility] Procedure: 2D Echo, Color Doppler and Cardiac Doppler Indications:     R06.03 Acute respiratory distress  History:         Patient has prior history of Echocardiogram examinations. COPD;                  Arrythmias:Atrial Fibrillation.  Sonographer:     Charmayne Sheer RDCS (AE) Referring Phys:  [id number] Awilda Bill Diagnosing Phys: Serafina Royals MD  Sonographer Comments:  Suboptimal apical window. Image acquisition challenging due to respiratory motion and pt on BIPAP. IMPRESSIONS  1. Left ventricular ejection fraction, by estimation, is 65 to 70%. The left ventricle has normal function. The left ventricle has no regional wall motion abnormalities. Left ventricular diastolic parameters were  normal.  2. Right ventricular systolic function is normal. The right ventricular size is normal.  3. Large pleural effusion in the left lateral region.  4. The mitral valve is normal in structure. Trivial mitral valve regurgitation.  5. The aortic valve is normal in structure. Aortic valve regurgitation is not visualized. FINDINGS  Left Ventricle: Left ventricular ejection fraction, by estimation, is 65 to 70%. The left ventricle has normal function. The left ventricle has no regional wall motion abnormalities. The left ventricular internal cavity size was small. There is no left ventricular hypertrophy. Left ventricular diastolic parameters were normal. Right Ventricle: The right ventricular size is normal. No increase in right ventricular wall thickness. Right ventricular systolic function is normal. Left Atrium: Left atrial size was normal in size. Right Atrium: Right atrial size was normal in size. Pericardium: There is no evidence of pericardial effusion. Mitral Valve: The mitral valve is normal in structure. Trivial mitral valve regurgitation. MV peak gradient, 4.4 mmHg. The mean mitral valve gradient is 1.0 mmHg. Tricuspid Valve: The tricuspid valve is normal in structure. Tricuspid valve regurgitation is trivial. Aortic Valve: The aortic valve is normal in structure. Aortic valve regurgitation is not visualized. Aortic valve mean gradient measures 2.0 mmHg. Aortic valve peak gradient measures 5.1 mmHg. Aortic valve area, by VTI measures 2.80 cm. Pulmonic Valve: The pulmonic valve was normal in structure. Pulmonic valve regurgitation is not visualized. Aorta: The aortic root and ascending aorta are structurally normal, with no evidence of dilitation. IAS/Shunts: No atrial level shunt detected by color flow Doppler. Additional Comments: There is a large pleural effusion in the left lateral region.  LEFT VENTRICLE PLAX 2D LVIDd:         3.20 cm  Diastology LVIDs:         2.30 cm  LV e' lateral:   10.20 cm/s LV  PW:         1.00 cm  LV E/e' lateral: 8.2 LV IVS:        0.80 cm LVOT diam:     2.20 cm LV SV:         36 LV SV Index:   22 LVOT Area:     3.80 cm  RIGHT VENTRICLE RV Basal diam:  3.30 cm LEFT ATRIUM         Index LA diam:    2.60 cm 1.56 cm/m  AORTIC VALVE                   PULMONIC VALVE AV Area (Vmax):    2.50 cm    PV Vmax:       1.50 m/s AV Area (Vmean):   2.58 cm    PV Vmean:      103.000 cm/s AV Area (VTI):     2.80 cm    PV VTI:        0.180 m AV Vmax:           113.00 cm/s PV Peak grad:  9.0 mmHg AV Vmean:          70.700 cm/s PV Mean grad:  5.0 mmHg AV VTI:            0.129 m AV Peak Grad:  5.1 mmHg AV Mean Grad:      2.0 mmHg LVOT Vmax:         74.40 cm/s LVOT Vmean:        47.900 cm/s LVOT VTI:          0.095 m LVOT/AV VTI ratio: 0.74  AORTA Ao Root diam: 3.80 cm MITRAL VALVE               TRICUSPID VALVE MV Area (PHT): 4.33 cm    TR Peak grad:   23.0 mmHg MV Peak grad:  4.4 mmHg    TR Vmax:        240.00 cm/s MV Mean grad:  1.0 mmHg MV Vmax:       1.05 m/s    SHUNTS MV Vmean:      53.4 cm/s   Systemic VTI:  0.09 m MV Decel Time: 175 msec    Systemic Diam: 2.20 cm MV E velocity: 83.80 cm/s MV A velocity: 34.70 cm/s MV E/A ratio:  2.41 Serafina Royals MD Electronically signed by Serafina Royals MD Signature Date/Time: 07/22/2020/5:02:55 PM    Final    [place] EKG SITE RITE  Result Date: 07/21/2020 If Site Rite image not attached, placement could not be confirmed due to current cardiac rhythm.   Microbiology Recent Results (from the past 240 hour(s))  Urine culture     Status: Abnormal   Collection Time: 08/09/2020  4:47 PM   Specimen: In/Out Cath Urine  Result Value Ref Range Status   Specimen Description   Final    IN/OUT CATH URINE Performed at [facility], [address], [place], [place] [address]    Special Requests   Final    NONE Performed at [facility], [address], [place], [place] [address]    Culture (A)  Final    <10,000 COLONIES/mL INSIGNIFICANT  GROWTH Performed at [facility] [address], [place], [place] [address]    Report Status 07/22/2020 FINAL  Final  Blood culture (routine x 2)     Status: None (Preliminary result)   Collection Time: 07/29/2020  4:47 PM   Specimen: BLOOD  Result Value Ref Range Status   Specimen Description   Final    BLOOD Performed at [facility], [address], [place], [place] [address]    Special Requests   Final    NONE Performed at [facility], [address] [place], [place] [address]    Culture  Setup Time   Final    GRAM POSITIVE COCCI AEROBIC BOTTLE ONLY CRITICAL RESULT CALLED TO, READ BACK BY AND VERIFIED WITH: JASON ROBINS AT [id number] ON 07/21/20 BY SS Performed at [facility], [place] [address] [place],  [address]    Culture GRAM POSITIVE COCCI  Final   Report Status PENDING  Incomplete  Blood culture (routine x 2)     Status: None (Preliminary result)   Collection Time: 08/11/2020  4:47 PM   Specimen: BLOOD  Result Value Ref Range Status   Specimen Description BLOOD RIGHT ANTECUBITAL  Final   Special Requests   Final    BOTTLES DRAWN AEROBIC AND ANAEROBIC Blood Culture results may not be optimal due to an inadequate volume of blood received in culture bottles   Culture   Final    NO GROWTH 2 DAYS Performed at [facility], [address] [place],  [address]    Report Status PENDING  Incomplete  Resp Panel by  RT-PCR (Flu A&B, Covid) Nasopharyngeal Swab     Status: None   Collection Time: 08/10/2020  4:47 PM   Specimen: Nasopharyngeal Swab; Nasopharyngeal(NP) swabs in vial transport medium  Result Value Ref Range Status   SARS Coronavirus 2 by RT PCR NEGATIVE NEGATIVE Final    Comment: (NOTE) SARS-CoV-2 target nucleic acids are NOT DETECTED.  The SARS-CoV-2 RNA is generally detectable in upper respiratory specimens during the acute phase of infection. The lowest concentration of SARS-CoV-2 viral copies this assay can  detect is 138 copies/mL. A negative result does not preclude SARS-Cov-2 infection and should not be used as the sole basis for treatment or other patient management decisions. A negative result may occur with  improper specimen collection/handling, submission of specimen other than nasopharyngeal swab, presence of viral mutation(s) within the areas targeted by this assay, and inadequate number of viral copies(<138 copies/mL). A negative result must be combined with clinical observations, patient history, and epidemiological information. The expected result is Negative.  Fact Sheet for Patients:  EntrepreneurPulse.com.au  Fact Sheet for Healthcare Providers:  IncredibleEmployment.be  This test is no t yet approved or cleared by the [place] FDA and  has been authorized for detection and/or diagnosis of SARS-CoV-2 by FDA under an Emergency Use Authorization (EUA). This EUA will remain  in effect (meaning this test can be used) for the duration of the COVID-19 declaration under Section 564(b)(1) of the Act, 21 U.S.C.section 360bbb-3(b)(1), unless the authorization is terminated  or revoked sooner.       Influenza A by PCR NEGATIVE NEGATIVE Final   Influenza B by PCR NEGATIVE NEGATIVE Final    Comment: (NOTE) The Xpert Xpress SARS-CoV-2/FLU/RSV plus assay is intended as an aid in the diagnosis of influenza from Nasopharyngeal swab specimens and should not be used as a sole basis for treatment. Nasal washings and aspirates are unacceptable for Xpert Xpress SARS-CoV-2/FLU/RSV testing.  Fact Sheet for Patients: EntrepreneurPulse.com.au  Fact Sheet for Healthcare Providers: IncredibleEmployment.be  This test is not yet approved or cleared by the [place] FDA and has been authorized for detection and/or diagnosis of SARS-CoV-2 by FDA under an Emergency Use Authorization (EUA). This EUA will remain in  effect (meaning this test can be used) for the duration of the COVID-19 declaration under Section 564(b)(1) of the Act, 21 U.S.C. section 360bbb-3(b)(1), unless the authorization is terminated or revoked.  Performed at [facility], [address] [place], [place] [address]   Blood Culture ID Panel (Reflexed)     Status: Abnormal   Collection Time: 08/16/2020  4:47 PM  Result Value Ref Range Status   Enterococcus faecalis NOT DETECTED NOT DETECTED Final   Enterococcus Faecium NOT DETECTED NOT DETECTED Final   Listeria monocytogenes NOT DETECTED NOT DETECTED Final   Staphylococcus species DETECTED (A) NOT DETECTED Final    Comment: CRITICAL RESULT CALLED TO, READ BACK BY AND VERIFIED WITH: JASON ROBINS AT 1825 ON 07/21/20 BY SS    Staphylococcus aureus (BCID) NOT DETECTED NOT DETECTED Final   Staphylococcus epidermidis NOT DETECTED NOT DETECTED Final   Staphylococcus lugdunensis NOT DETECTED NOT DETECTED Final   Streptococcus species NOT DETECTED NOT DETECTED Final   Streptococcus agalactiae NOT DETECTED NOT DETECTED Final   Streptococcus pneumoniae NOT DETECTED NOT DETECTED Final   Streptococcus pyogenes NOT DETECTED NOT DETECTED Final   A.calcoaceticus-baumannii NOT DETECTED NOT DETECTED Final   Bacteroides fragilis NOT DETECTED NOT DETECTED Final   Enterobacterales NOT DETECTED NOT DETECTED Final   Enterobacter cloacae  complex NOT DETECTED NOT DETECTED Final   Escherichia coli NOT DETECTED NOT DETECTED Final   Klebsiella aerogenes NOT DETECTED NOT DETECTED Final   Klebsiella oxytoca NOT DETECTED NOT DETECTED Final   Klebsiella pneumoniae NOT DETECTED NOT DETECTED Final   Proteus species NOT DETECTED NOT DETECTED Final   Salmonella species NOT DETECTED NOT DETECTED Final   Serratia marcescens NOT DETECTED NOT DETECTED Final   Haemophilus influenzae NOT DETECTED NOT DETECTED Final   Neisseria meningitidis NOT DETECTED NOT DETECTED Final   Pseudomonas aeruginosa NOT  DETECTED NOT DETECTED Final   Stenotrophomonas maltophilia NOT DETECTED NOT DETECTED Final   Candida albicans NOT DETECTED NOT DETECTED Final   Candida auris NOT DETECTED NOT DETECTED Final   Candida glabrata NOT DETECTED NOT DETECTED Final   Candida krusei NOT DETECTED NOT DETECTED Final   Candida parapsilosis NOT DETECTED NOT DETECTED Final   Candida tropicalis NOT DETECTED NOT DETECTED Final   Cryptococcus neoformans/gattii NOT DETECTED NOT DETECTED Final    Comment: Performed at [facility], [address] [place], [place] [address]  Culture, sputum-assessment     Status: None   Collection Time: 07/26/2020  7:50 PM   Specimen: Expectorated Sputum  Result Value Ref Range Status   Specimen Description EXPSU  Final   Special Requests NONE  Final   Sputum evaluation   Final    THIS SPECIMEN IS ACCEPTABLE FOR SPUTUM CULTURE Performed at [facility], [address], [place], [place] [address]    Report Status 07/22/2020 FINAL  Final  Culture, respiratory     Status: None (Preliminary result)   Collection Time: 07/28/2020  7:50 PM  Result Value Ref Range Status   Specimen Description   Final    EXPSU Performed at [facility], [address], [place], [place] [address]    Special Requests   Final    NONE Reflexed from [id number] Performed at [facility], [address] [place], [place] [address]    Gram Stain   Final    RARE WBC PRESENT, PREDOMINANTLY PMN ABUNDANT GRAM POSITIVE COCCI IN CHAINS IN CLUSTERS    Culture   Final    ABUNDANT STAPHYLOCOCCUS AUREUS CULTURE REINCUBATED FOR BETTER GROWTH Performed at [facility], [address] [place],  [address]    Report Status PENDING  Incomplete  Gastrointestinal Panel by PCR , Stool     Status: None   Collection Time: 07/21/20  9:42 AM   Specimen: Stool  Result Value Ref Range Status   Campylobacter species NOT DETECTED NOT DETECTED Final   Plesimonas shigelloides NOT  DETECTED NOT DETECTED Final   Salmonella species NOT DETECTED NOT DETECTED Final   Yersinia enterocolitica NOT DETECTED NOT DETECTED Final   Vibrio species NOT DETECTED NOT DETECTED Final   Vibrio cholerae NOT DETECTED NOT DETECTED Final   Enteroaggregative E coli (EAEC) NOT DETECTED NOT DETECTED Final   Enteropathogenic E coli (EPEC) NOT DETECTED NOT DETECTED Final   Enterotoxigenic E coli (ETEC) NOT DETECTED NOT DETECTED Final   Shiga like toxin producing E coli (STEC) NOT DETECTED NOT DETECTED Final   Shigella/Enteroinvasive E coli (EIEC) NOT DETECTED NOT DETECTED Final   Cryptosporidium NOT DETECTED NOT DETECTED Final   Cyclospora cayetanensis NOT DETECTED NOT DETECTED Final   Entamoeba histolytica NOT DETECTED NOT DETECTED Final   Giardia lamblia NOT DETECTED NOT DETECTED Final   Adenovirus F40/41 NOT DETECTED NOT DETECTED Final   Astrovirus NOT DETECTED NOT DETECTED Final  Norovirus GI/GII NOT DETECTED NOT DETECTED Final   Rotavirus A NOT DETECTED NOT DETECTED Final   Sapovirus (I, II, IV, and V) NOT DETECTED NOT DETECTED Final    Comment: Performed at [facility], [address], [place], [place] [address]  C Difficile Quick Screen w PCR reflex     Status: None   Collection Time: 07/21/20  9:42 AM  Result Value Ref Range Status   C Diff antigen NEGATIVE NEGATIVE Final   C Diff toxin NEGATIVE NEGATIVE Final   C Diff interpretation No C. difficile detected.  Final    Comment: Performed at [facility], [address] [place], [place] [address]  MRSA PCR Screening     Status: Abnormal   Collection Time: 07/21/20 11:25 PM   Specimen: Nasopharyngeal  Result Value Ref Range Status   MRSA by PCR POSITIVE (A) NEGATIVE Final    Comment:        The GeneXpert MRSA Assay (FDA approved for NASAL specimens only), is one component of a comprehensive MRSA colonization surveillance program. It is not intended to diagnose MRSA infection nor to guide or monitor  treatment for MRSA infections. RESULT CALLED TO, READ BACK BY AND VERIFIED WITH: TY [place] RN [id number] 07/22/20 HNM Performed at [facility], [address] [place], [place] [address]     Lab Basic Metabolic Panel: Recent Labs  Lab Jul 25, 2020 1419 July 25, 2020 1950 07/21/20 0530 07/21/20 2336 07/22/20 0553 07/22/20 1638  NA 117* 120* 124* 125*  --   --   K 3.4* 3.0* 3.3* 4.2  --   --   CL 76* 83* 87* 94*  --   --   CO2 28 26 28  20*  --   --   GLUCOSE 124* 97 96 88  --  53*  BUN 10 14 16 23   --   --   CREATININE 0.52* 0.60* 0.69 0.84  --   --   CALCIUM 8.8* 7.8* 7.7* 7.3*  --   --   MG 1.6*  --  2.2  --  2.6*  --    Liver Function Tests: Recent Labs  Lab 07-25-2020 1419 07/21/20 0530 07/21/20 2336  AST 48* 43* 44*  ALT 19 15 16   ALKPHOS 87 55 66  BILITOT 2.5* 1.7* 1.1  PROT 6.8 4.9* 4.7*  ALBUMIN 3.4* 2.5* 2.0*   No results for input(s): LIPASE, AMYLASE in the last 168 hours. No results for input(s): AMMONIA in the last 168 hours. CBC: Recent Labs  Lab 07-25-2020 1419 07/21/20 0530 07/22/20 0553  WBC 22.4* 14.2* 15.3*  NEUTROABS 19.8*  --  12.9*  HGB 13.8 12.7* 11.1*  HCT 39.3 37.3* 33.6*  MCV 94.0 98.2 100.3*  PLT 124* 102* 113*   Cardiac Enzymes: No results for input(s): CKTOTAL, CKMB, CKMBINDEX, TROPONINI in the last 168 hours. Sepsis Labs: Recent Labs  Lab 2020/07/25 1419 07/25/20 1442 07/21/20 0530 07/21/20 0937 07/22/20 0553 07/22/20 0556 07/22/20 1053 07/22/20 2312  PROCALCITON 2.58  --   --   --   --   --   --  44.03  WBC 22.4*  --  14.2*  --  15.3*  --   --   --   LATICACIDVEN  --    < > 2.6* 0.8  --  3.4* 3.9*  --    < > = values in this interval not displayed.    Procedures/Operations  1/4: Endotracheal intubation 1/4: Left IJ CVC placed  Darel Hong, AGACNP-BC [facility] Pager: [phone]  Bradly Bienenstock 2020-08-18, 1:37 AM

## 2020-08-19 DEATH — deceased

## 2020-09-03 LAB — BLOOD GAS, ARTERIAL
Acid-base deficit: 9.4 mmol/L — ABNORMAL HIGH (ref 0.0–2.0)
Bicarbonate: 21.7 mmol/L (ref 20.0–28.0)
FIO2: 1
O2 Saturation: 84.2 %
Patient temperature: 37
pCO2 arterial: 75 mmHg (ref 32.0–48.0)

## 2022-07-24 IMAGING — DX DG ABDOMEN 1V
1 series · 1 of 1 positions shown · non-contrast
Comparison: 05/21/2020, CT 07/20/2020

CLINICAL DATA: OG tube placement

EXAM:
ABDOMEN - 1 VIEW

[abdomen supine]
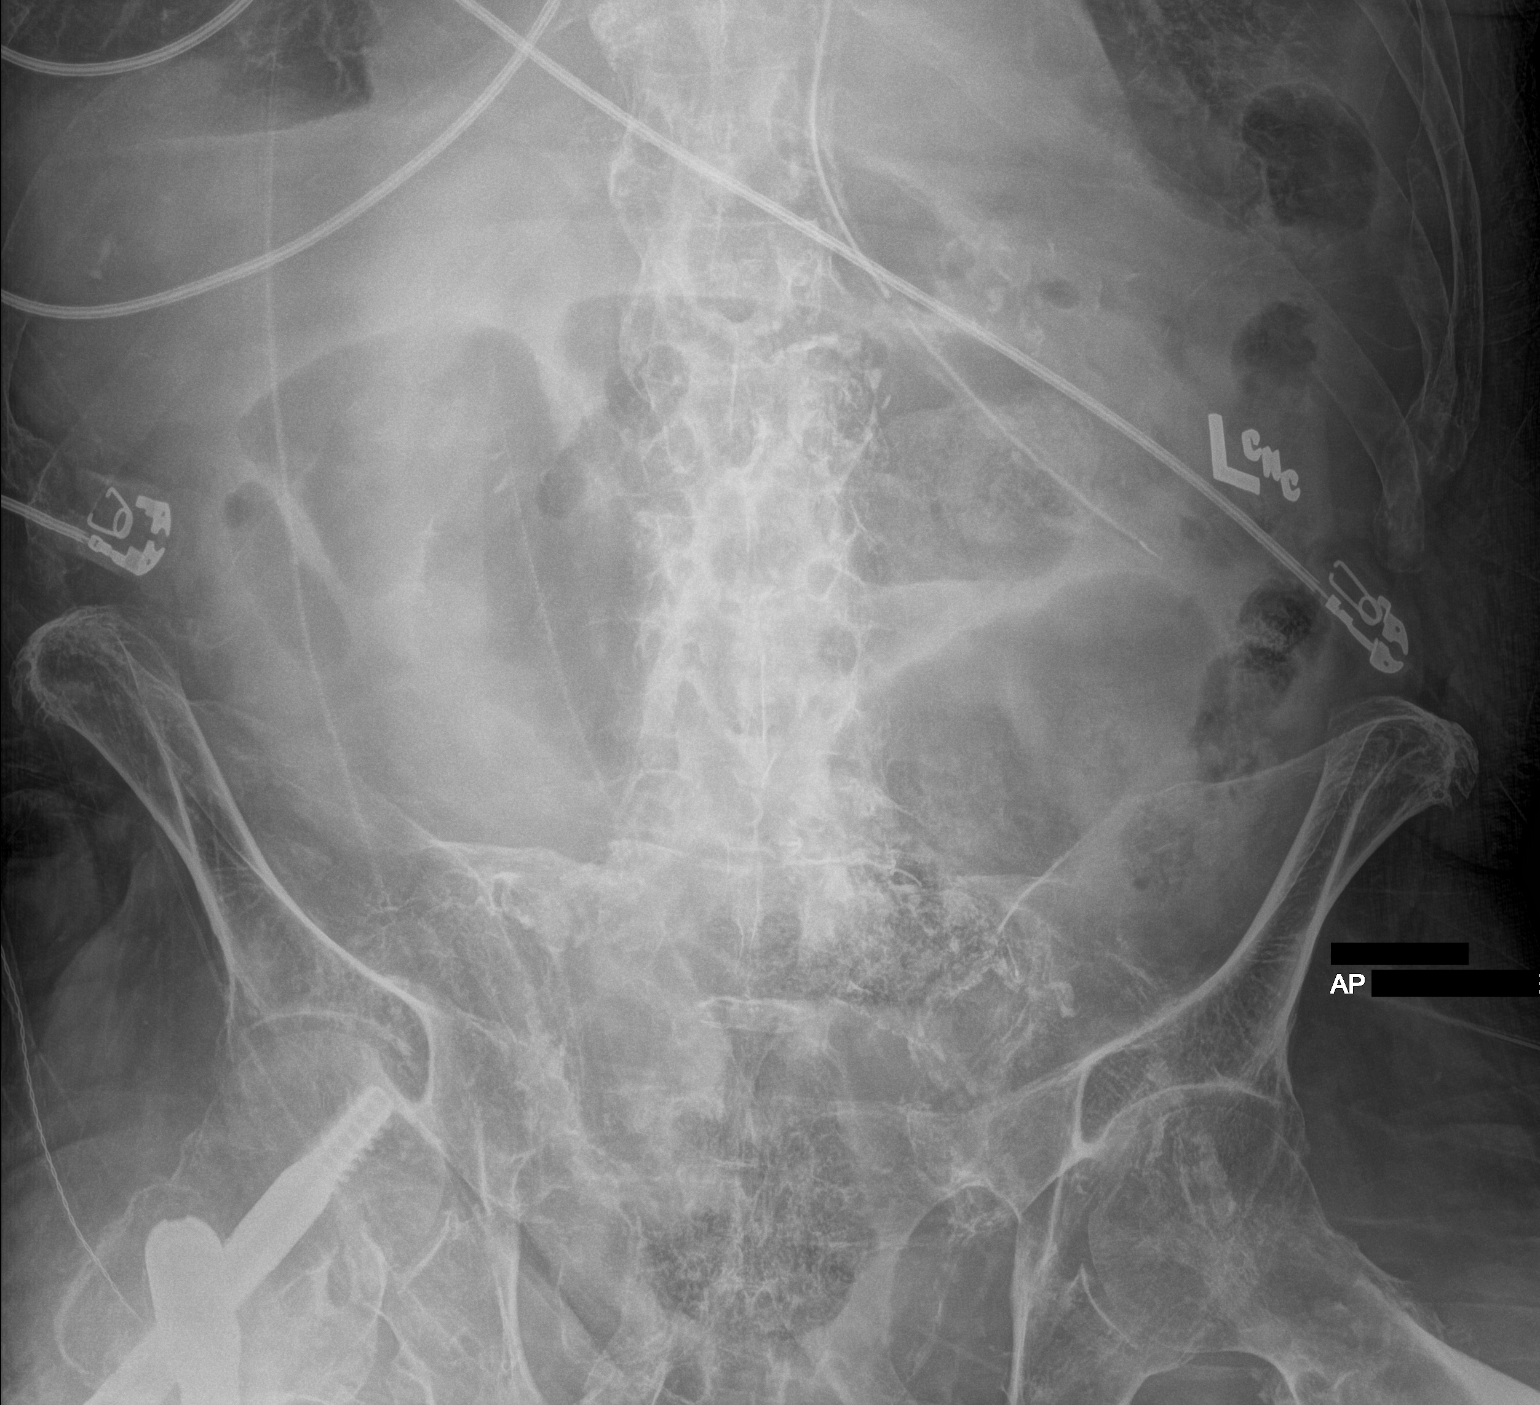

[1 of 1 positions shown; findings below may reference images not displayed]

FINDINGS: Esophageal tube tip and side port overlie the proximal stomach. Mild
central gaseous dilatation of bowel. Partially visualized right hip
hardware.
IMPRESSION: Esophageal tube tip overlies the proximal stomach. Mild gaseous
prominence of central bowel loops without definitive obstructive
pattern
